# Patient Record
Sex: Female | Born: 1990 | Race: Black or African American | Hispanic: No | Marital: Single | Smoking: Never smoker
Health system: Southern US, Community
[De-identification: ages and names within clinical notes are randomized; demographics above are authoritative.]

## PROBLEM LIST (undated history)

## (undated) ENCOUNTER — Emergency Department (HOSPITAL_COMMUNITY): Admission: EM | Payer: BC Managed Care – PPO | Source: Home / Self Care

## (undated) ENCOUNTER — Inpatient Hospital Stay (HOSPITAL_COMMUNITY): Payer: Self-pay

## (undated) DIAGNOSIS — Z789 Other specified health status: Secondary | ICD-10-CM

## (undated) DIAGNOSIS — Z8619 Personal history of other infectious and parasitic diseases: Secondary | ICD-10-CM

## (undated) HISTORY — PX: NO PAST SURGERIES: SHX2092

---
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
## 2008-07-17 ENCOUNTER — Inpatient Hospital Stay (HOSPITAL_COMMUNITY): Admission: AD | Admit: 2008-07-17 | Discharge: 2008-07-18 | Payer: Self-pay | Admitting: Family Medicine

## 2008-07-17 ENCOUNTER — Ambulatory Visit: Payer: Self-pay | Admitting: Advanced Practice Midwife

## 2008-12-19 ENCOUNTER — Emergency Department (HOSPITAL_COMMUNITY): Admission: EM | Admit: 2008-12-19 | Discharge: 2008-12-19 | Payer: Self-pay | Admitting: Emergency Medicine

## 2009-01-31 ENCOUNTER — Encounter (INDEPENDENT_AMBULATORY_CARE_PROVIDER_SITE_OTHER): Payer: Self-pay | Admitting: Obstetrics and Gynecology

## 2009-01-31 ENCOUNTER — Inpatient Hospital Stay (HOSPITAL_COMMUNITY): Admission: AD | Admit: 2009-01-31 | Discharge: 2009-02-02 | Payer: Self-pay | Admitting: Obstetrics and Gynecology

## 2010-07-01 LAB — COMPREHENSIVE METABOLIC PANEL
ALT: 16 U/L (ref 0–35)
AST: 22 U/L (ref 0–37)
CO2: 21 mEq/L (ref 19–32)
Calcium: 8.9 mg/dL (ref 8.4–10.5)
Chloride: 106 mEq/L (ref 96–112)
GFR calc non Af Amer: >60 mL/min (ref >60)
Glucose, Bld: 74 mg/dL (ref 70–99)
Sodium: 136 mEq/L (ref 135–145)
Total Bilirubin: 0.5 mg/dL (ref 0.3–1.2)

## 2010-07-01 LAB — CBC
HCT: 36.9 % (ref 36.0–46.0)
Hemoglobin: 11.9 g/dL — ABNORMAL LOW (ref 12.0–15.0)
Hemoglobin: 9.2 g/dL — ABNORMAL LOW (ref 12.0–15.0)
MCHC: 32.3 g/dL (ref 30.0–36.0)
MCHC: 33 g/dL (ref 30.0–36.0)
Platelets: 131 10*3/uL — ABNORMAL LOW (ref 150–400)
RBC: 4.35 MIL/uL (ref 3.87–5.11)
RDW: 13.9 % (ref 11.5–15.5)
RDW: 14.3 % (ref 11.5–15.5)

## 2010-07-01 LAB — URIC ACID: Uric Acid, Serum: 4 mg/dL (ref 2.4–7.0)

## 2010-07-03 LAB — POCT RAPID STREP A (OFFICE): Streptococcus, Group A Screen (Direct): NEGATIVE

## 2010-07-08 LAB — URINALYSIS, ROUTINE W REFLEX MICROSCOPIC
Bilirubin Urine: NEGATIVE
Hgb urine dipstick: NEGATIVE
Nitrite: NEGATIVE
Protein, ur: NEGATIVE mg/dL
Urobilinogen, UA: 0.2 mg/dL (ref 0.0–1.0)

## 2010-07-08 LAB — PREGNANCY, URINE: Preg Test, Ur: POSITIVE

## 2011-07-04 ENCOUNTER — Emergency Department (INDEPENDENT_AMBULATORY_CARE_PROVIDER_SITE_OTHER): Payer: PRIVATE HEALTH INSURANCE

## 2011-07-04 ENCOUNTER — Encounter (HOSPITAL_COMMUNITY): Payer: Self-pay | Admitting: *Deleted

## 2011-07-04 ENCOUNTER — Emergency Department (INDEPENDENT_AMBULATORY_CARE_PROVIDER_SITE_OTHER)
Admission: EM | Admit: 2011-07-04 | Discharge: 2011-07-04 | Disposition: A | Discharge status: Home / Self Care | Payer: PRIVATE HEALTH INSURANCE | Source: Home / Self Care | Attending: Family Medicine | Admitting: Family Medicine

## 2011-07-04 DIAGNOSIS — M25539 Pain in unspecified wrist: Secondary | ICD-10-CM

## 2011-07-04 DIAGNOSIS — S60219A Contusion of unspecified wrist, initial encounter: Secondary | ICD-10-CM

## 2011-07-04 DIAGNOSIS — M25531 Pain in right wrist: Secondary | ICD-10-CM

## 2011-07-04 DIAGNOSIS — S60211A Contusion of right wrist, initial encounter: Secondary | ICD-10-CM

## 2011-07-04 MED ORDER — KETOROLAC TROMETHAMINE 60 MG/2ML IM SOLN
60.0000 mg | Freq: Once | INTRAMUSCULAR | Status: AC
  Administered 2011-07-04: 60 mg via INTRAMUSCULAR

## 2011-07-04 MED ORDER — HYDROCODONE-ACETAMINOPHEN 5-325 MG PO TABS: 1.0000 | ORAL_TABLET | Freq: Three times a day (TID) | ORAL | Status: AC | PRN

## 2011-07-04 MED ORDER — MELOXICAM 15 MG PO TABS: 15.0000 mg | ORAL_TABLET | Freq: Every day | ORAL | Status: DC

## 2011-07-04 NOTE — ED Notes (Signed)
Pt with pain swelling posterior right wrist - per pt hit wall hard with palm of right hand last night - wrist snapped back pain and swelling

## 2011-07-04 NOTE — Discharge Instructions (Signed)

## 2011-07-04 NOTE — ED Provider Notes (Signed)
History    Injury to R wrist CSN: [id number]  Arrival date & time 07/04/11  1002   First MD Initiated Contact with Patient 07/04/11 1011      Chief Complaint  Patient presents with  . Wrist Pain  . Wrist Injury    (Consider location/radiation/quality/duration/timing/severity/associated sxs/prior treatment) Patient is a 21 y.o. female presenting with wrist pain and wrist injury. The history is provided by the patient.  Wrist Pain This is a new problem. The current episode started yesterday (Patient states she slapped the wall out of frustration with the palmar surface of her right hand and now has tenderness over the dorsum of her right wrist and swelling.). The problem occurs constantly. The problem has been gradually worsening. Associated symptoms comments: Pain the R wrist. Exacerbated by: movement and use. The symptoms are relieved by nothing.  Wrist Injury     History reviewed. No pertinent past medical history.  History reviewed. No pertinent past surgical history.  History reviewed. No pertinent family history.  History  Substance Use Topics  . Smoking status: Never Smoker   . Smokeless tobacco: Not on file  . Alcohol Use: Yes    OB History    Grav Para Term Preterm Abortions TAB SAB Ect Mult Living                  Review of Systems  Musculoskeletal: Positive for joint swelling and arthralgias.  All other systems reviewed and are negative.    Allergies  Review of patient's allergies indicates no known allergies.  Home Medications  No current outpatient prescriptions on file.  BP 117/66  Pulse 84  Temp(Src) 99.1 F (37.3 C) (Oral)  Resp 18  Wt 125 lb (56.7 kg)  SpO2 99%  LMP 07/01/2011  Physical Exam  Constitutional: She is oriented to person, place, and time. She appears well-developed and well-nourished.  HENT:  Head: Normocephalic.  Musculoskeletal: She exhibits tenderness.       Decrease rang of motion of R wist. Tenderness over the mid  dorsum of the right wrist. No tenderness over the snuffbox is some tenderness along the superior aspect of the radius.  Neurological: She is alert and oriented to person, place, and time.  Skin: Skin is warm and dry.  Psychiatric: She has a normal mood and affect. Her behavior is normal.    ED Course  Procedures (including critical care time)  Labs Reviewed - No data to display Dg Wrist Complete Right  07/04/2011  *RADIOLOGY REPORT*  Clinical Data:  wrist pain  RIGHT WRIST - COMPLETE 3+ VIEW  Comparison: None  Findings: There is no evidence of fracture or dislocation.  There is no evidence of arthropathy or other focal bone abnormality. Soft tissues are unremarkable.  IMPRESSION: Negative examination.  Original Report Authenticated By: Rosealee Albee, M.D.      MDM   Right wrist contusion. Cockup wrist splint. Mobic 15 mg one tablet a day. Hydrocodone 5-25 one by mouth every 6 hours when necessary basis. If not better by Thursday followup orthopedic or primary care.      Hassan Rowan, MD 07/04/11 2145

## 2013-06-10 IMAGING — CR DG WRIST COMPLETE 3+V*R*
2 series · 2 of 2 positions shown · non-contrast
Comparison: None

CLINICAL DATA: wrist pain

RIGHT WRIST - COMPLETE 3+ VIEW

[view not recorded (1 of 2)]
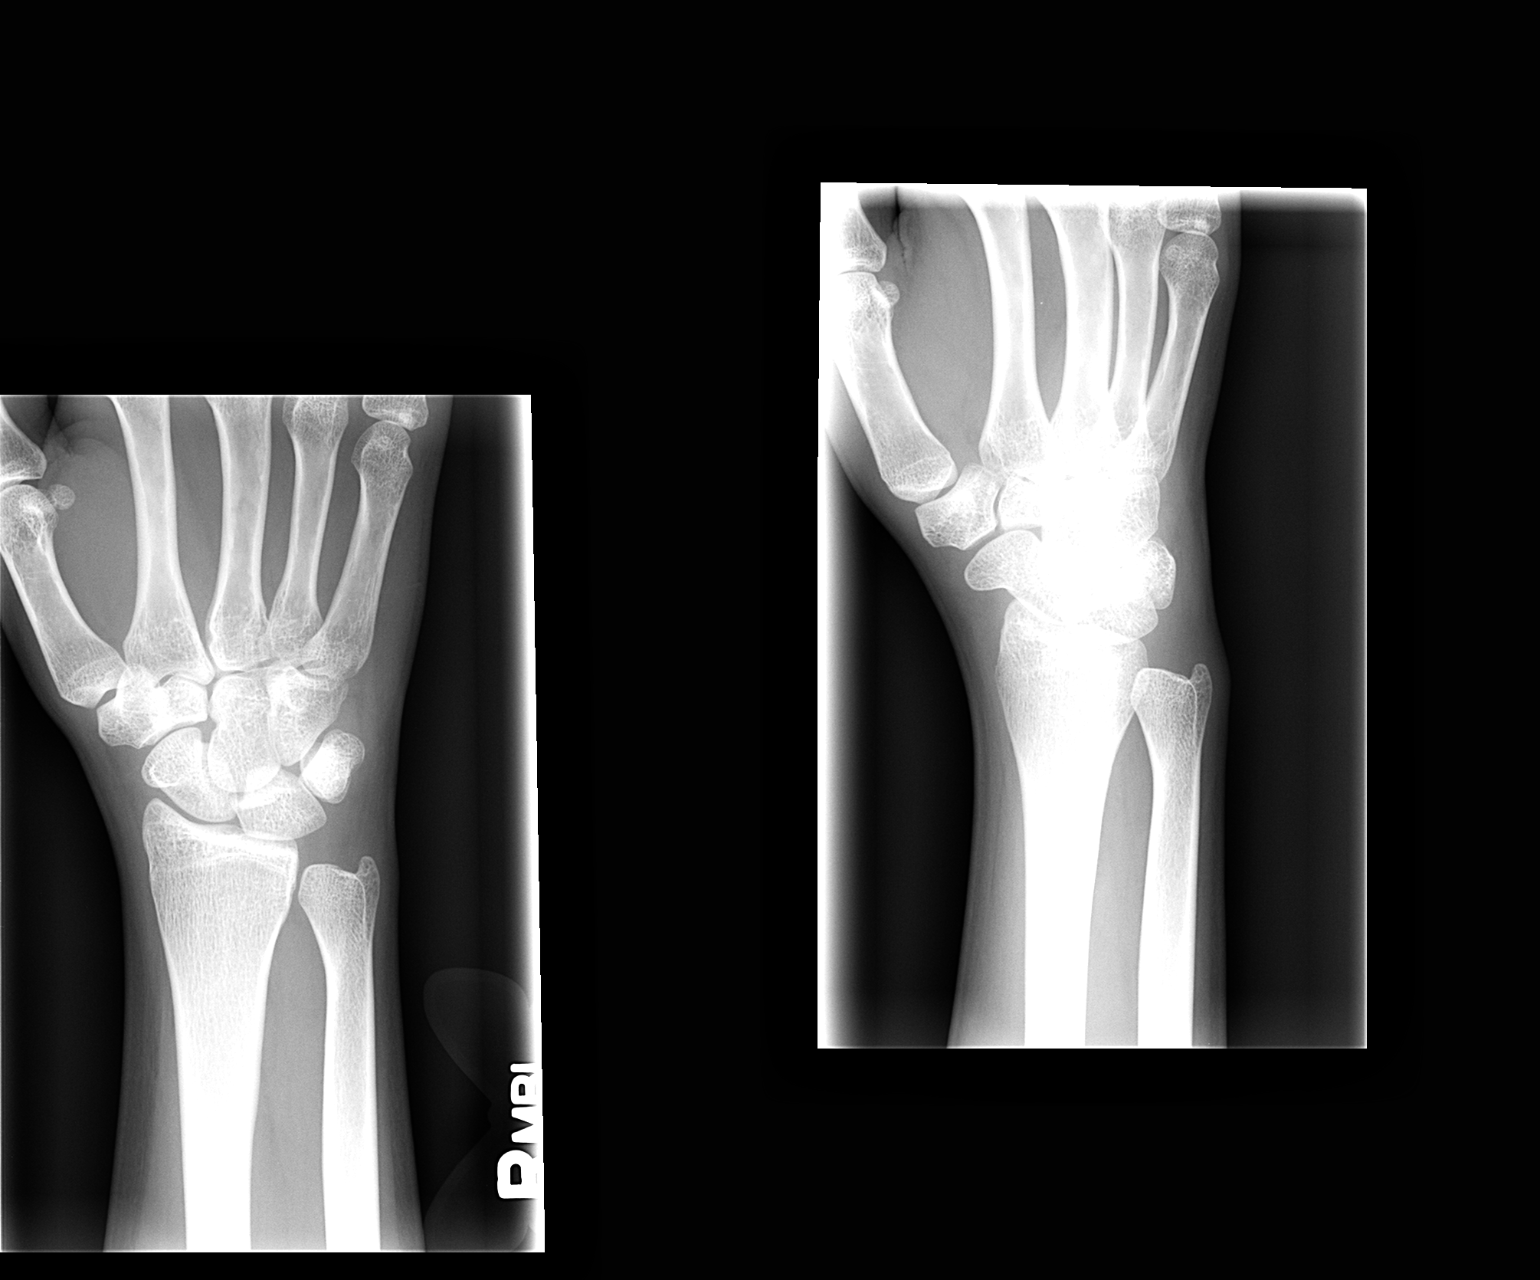

[view not recorded (2 of 2)]
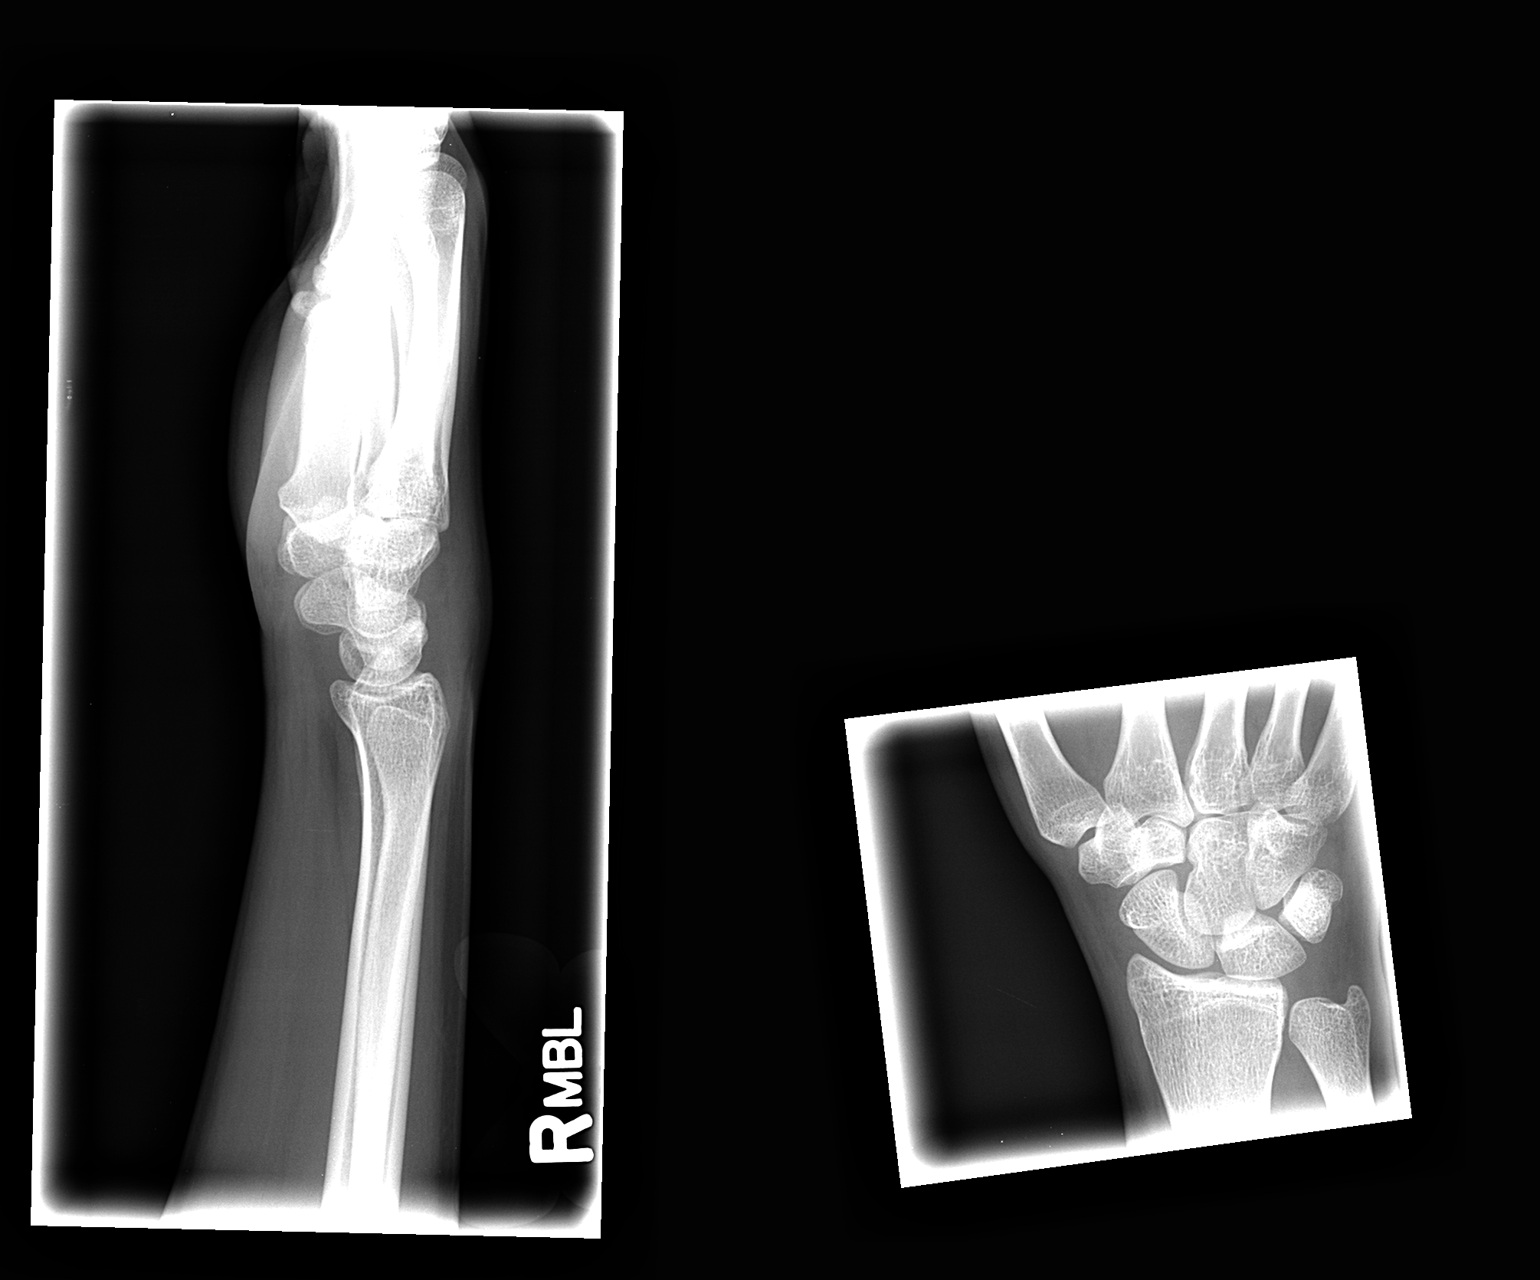

[2 of 2 positions shown; findings below may reference images not displayed]

FINDINGS: There is no evidence of fracture or dislocation.  There
is no evidence of arthropathy or other focal bone abnormality.
Soft tissues are unremarkable.
IMPRESSION: Negative examination.

## 2013-10-16 ENCOUNTER — Emergency Department (HOSPITAL_COMMUNITY)
Admission: EM | Admit: 2013-10-16 | Discharge: 2013-10-16 | Disposition: A | Discharge status: Home / Self Care | Payer: PRIVATE HEALTH INSURANCE | Source: Home / Self Care

## 2014-04-06 ENCOUNTER — Encounter (HOSPITAL_COMMUNITY): Payer: Self-pay | Admitting: Emergency Medicine

## 2014-04-06 ENCOUNTER — Emergency Department (HOSPITAL_COMMUNITY)
Admission: EM | Admit: 2014-04-06 | Discharge: 2014-04-06 | Disposition: A | Discharge status: Home / Self Care | Payer: BLUE CROSS/BLUE SHIELD | Source: Home / Self Care | Attending: Emergency Medicine | Admitting: Emergency Medicine

## 2014-04-06 DIAGNOSIS — J069 Acute upper respiratory infection, unspecified: Secondary | ICD-10-CM

## 2014-04-06 DIAGNOSIS — J029 Acute pharyngitis, unspecified: Secondary | ICD-10-CM

## 2014-04-06 LAB — POCT RAPID STREP A: STREPTOCOCCUS, GROUP A SCREEN (DIRECT): NEGATIVE

## 2014-04-06 NOTE — ED Provider Notes (Signed)
CSN: [id number]     Arrival date & time 04/06/14  1048 History   First MD Initiated Contact with Patient 04/06/14 1116     Chief Complaint  Patient presents with  . Sore Throat   (Consider location/radiation/quality/duration/timing/severity/associated sxs/prior Treatment) HPI Comments: XLK:GMWNPCP:none Reports herself to be otherwise healthy Nonsmoker Works in Therapist, sportsproperty management   Patient is a 24 y.o. female presenting with URI. The history is provided by the patient.  URI Presenting symptoms: congestion, cough and sore throat   Presenting symptoms: no ear pain, no facial pain, no fatigue, no fever and no rhinorrhea   Severity:  Mild Onset quality:  Gradual Duration:  1 week Timing:  Constant Progression:  Improving Chronicity:  New   History reviewed. No pertinent past medical history. History reviewed. No pertinent past surgical history. No family history on file. History  Substance Use Topics  . Smoking status: Never Smoker   . Smokeless tobacco: Not on file  . Alcohol Use: Yes   OB History    No data available     Review of Systems  Constitutional: Negative for fever and fatigue.  HENT: Positive for congestion and sore throat. Negative for ear pain and rhinorrhea.   Respiratory: Positive for cough.   Cardiovascular: Negative.   Musculoskeletal: Negative.     Allergies  Review of patient's allergies indicates no known allergies.  Home Medications   Prior to Admission medications   Medication Sig Start Date End Date Taking? Authorizing Provider  meloxicam (MOBIC) 15 MG tablet Take 1 tablet (15 mg total) by mouth daily. 07/04/11 07/03/12  Jeanelle MallingEugene H Wade, MD   BP 102/60 mmHg  Pulse 70  Temp(Src) 97.9 F (36.6 C) (Oral)  Resp 18  SpO2 100%  LMP 03/29/2014 Physical Exam  Constitutional: She is oriented to person, place, and time. She appears well-developed and well-nourished. No distress.  HENT:  Head: Normocephalic and atraumatic.  Right Ear: Hearing, tympanic  membrane, external ear and ear canal normal.  Left Ear: Hearing, tympanic membrane, external ear and ear canal normal.  Nose: Nose normal.  Mouth/Throat: Uvula is midline, oropharynx is clear and moist and mucous membranes are normal.  Eyes: Conjunctivae are normal. No scleral icterus.  Neck: Normal range of motion. Neck supple.  Cardiovascular: Normal rate, regular rhythm and normal heart sounds.   Pulmonary/Chest: Effort normal and breath sounds normal. No stridor. No respiratory distress. She has no wheezes.  Musculoskeletal: Normal range of motion.  Lymphadenopathy:    She has no cervical adenopathy.  Neurological: She is alert and oriented to person, place, and time.  Skin: Skin is warm and dry. No rash noted. No erythema.  Psychiatric: She has a normal mood and affect. Her behavior is normal.  Nursing note and vitals reviewed.   ED Course  Procedures (including critical care time) Labs Review Labs Reviewed  POCT RAPID STREP A ([facility] URG CARE ONLY)    Imaging Review No results found.   MDM   1. URI (upper respiratory infection)   2. Sore throat    Rapid strep negative Exam benign VS normal Symptomatic care at home Expect improvement over next few days Tylenol, ibuprofen, salt water gargles   Ria ClockJennifer Lee H Genita Nilsson, PA 04/06/14 1155

## 2014-04-06 NOTE — ED Notes (Signed)
C/o ST onset 1 week; yest night was very painful Has also had a dry cough and vomiting due to cough Denies f/v/n/d Alert, no signs of acute distress.

## 2014-04-06 NOTE — Discharge Instructions (Signed)
Rapid strep negative Exam benign VS normal Symptomatic care at home Expect improvement over next few days Tylenol, ibuprofen, salt water gargles Upper Respiratory Infection, Adult An upper respiratory infection (URI) is also sometimes known as the common cold. The upper respiratory tract includes the nose, sinuses, throat, trachea, and bronchi. Bronchi are the airways leading to the lungs. Most people improve within 1 week, but symptoms can last up to 2 weeks. A residual cough may last even longer.  CAUSES Many different viruses can infect the tissues lining the upper respiratory tract. The tissues become irritated and inflamed and often become very moist. Mucus production is also common. A cold is contagious. You can easily spread the virus to others by oral contact. This includes kissing, sharing a glass, coughing, or sneezing. Touching your mouth or nose and then touching a surface, which is then touched by another person, can also spread the virus. SYMPTOMS  Symptoms typically develop 1 to 3 days after you come in contact with a cold virus. Symptoms vary from person to person. They may include:  Runny nose.  Sneezing.  Nasal congestion.  Sinus irritation.  Sore throat.  Loss of voice (laryngitis).  Cough.  Fatigue.  Muscle aches.  Loss of appetite.  Headache.  Low-grade fever. DIAGNOSIS  You might diagnose your own cold based on familiar symptoms, since most people get a cold 2 to 3 times a year. Your caregiver can confirm this based on your exam. Most importantly, your caregiver can check that your symptoms are not due to another disease such as strep throat, sinusitis, pneumonia, asthma, or epiglottitis. Blood tests, throat tests, and X-rays are not necessary to diagnose a common cold, but they may sometimes be helpful in excluding other more serious diseases. Your caregiver will decide if any further tests are required. RISKS AND COMPLICATIONS  You may be at risk for a  more severe case of the common cold if you smoke cigarettes, have chronic heart disease (such as heart failure) or lung disease (such as asthma), or if you have a weakened immune system. The very young and very old are also at risk for more serious infections. Bacterial sinusitis, middle ear infections, and bacterial pneumonia can complicate the common cold. The common cold can worsen asthma and chronic obstructive pulmonary disease (COPD). Sometimes, these complications can require emergency medical care and may be life-threatening. PREVENTION  The best way to protect against getting a cold is to practice good hygiene. Avoid oral or hand contact with people with cold symptoms. Wash your hands often if contact occurs. There is no clear evidence that vitamin C, vitamin E, echinacea, or exercise reduces the chance of developing a cold. However, it is always recommended to get plenty of rest and practice good nutrition. TREATMENT  Treatment is directed at relieving symptoms. There is no cure. Antibiotics are not effective, because the infection is caused by a virus, not by bacteria. Treatment may include:  Increased fluid intake. Sports drinks offer valuable electrolytes, sugars, and fluids.  Breathing heated mist or steam (vaporizer or shower).  Eating chicken soup or other clear broths, and maintaining good nutrition.  Getting plenty of rest.  Using gargles or lozenges for comfort.  Controlling fevers with ibuprofen or acetaminophen as directed by your caregiver.  Increasing usage of your inhaler if you have asthma. Zinc gel and zinc lozenges, taken in the first 24 hours of the common cold, can shorten the duration and lessen the severity of symptoms. Pain medicines may help  with fever, muscle aches, and throat pain. A variety of non-prescription medicines are available to treat congestion and runny nose. Your caregiver can make recommendations and may suggest nasal or lung inhalers for other  symptoms.  HOME CARE INSTRUCTIONS   Only take over-the-counter or prescription medicines for pain, discomfort, or fever as directed by your caregiver.  Use a warm mist humidifier or inhale steam from a shower to increase air moisture. This may keep secretions moist and make it easier to breathe.  Drink enough water and fluids to keep your urine clear or pale yellow.  Rest as needed.  Return to work when your temperature has returned to normal or as your caregiver advises. You may need to stay home longer to avoid infecting others. You can also use a face mask and careful hand washing to prevent spread of the virus. SEEK MEDICAL CARE IF:   After the first few days, you feel you are getting worse rather than better.  You need your caregiver's advice about medicines to control symptoms.  You develop chills, worsening shortness of breath, or brown or red sputum. These may be signs of pneumonia.  You develop yellow or brown nasal discharge or pain in the face, especially when you bend forward. These may be signs of sinusitis.  You develop a fever, swollen neck glands, pain with swallowing, or white areas in the back of your throat. These may be signs of strep throat. SEEK IMMEDIATE MEDICAL CARE IF:   You have a fever.  You develop severe or persistent headache, ear pain, sinus pain, or chest pain.  You develop wheezing, a prolonged cough, cough up blood, or have a change in your usual mucus (if you have chronic lung disease).  You develop sore muscles or a stiff neck. Document Released: 09/08/2000 Document Revised: 06/07/2011 Document Reviewed: 06/20/2013 [facility] Patient Information 2015 [place], [place]. This information is not intended to replace advice given to you by your health care provider. Make sure you discuss any questions you have with your health care provider.  Salt Water Gargle This solution will help make your mouth and throat feel better. HOME CARE INSTRUCTIONS   Mix  1 teaspoon of salt in 8 ounces of warm water.  Gargle with this solution as much or often as you need or as directed. Swish and gargle gently if you have any sores or wounds in your mouth.  Do not swallow this mixture. Document Released: 12/18/2003 Document Revised: 06/07/2011 Document Reviewed: 05/10/2008 [facility] Patient Information 2015 [place], [place]. This information is not intended to replace advice given to you by your health care provider. Make sure you discuss any questions you have with your health care provider.  Sore Throat A sore throat is pain, burning, irritation, or scratchiness of the throat. There is often pain or tenderness when swallowing or talking. A sore throat may be accompanied by other symptoms, such as coughing, sneezing, fever, and swollen neck glands. A sore throat is often the first sign of another sickness, such as a cold, flu, strep throat, or mononucleosis (commonly known as mono). Most sore throats go away without medical treatment. CAUSES  The most common causes of a sore throat include:  A viral infection, such as a cold, flu, or mono.  A bacterial infection, such as strep throat, tonsillitis, or whooping cough.  Seasonal allergies.  Dryness in the air.  Irritants, such as smoke or pollution.  Gastroesophageal reflux disease (GERD). HOME CARE INSTRUCTIONS   Only take over-the-counter medicines as  directed by your caregiver.  Drink enough fluids to keep your urine clear or pale yellow.  Rest as needed.  Try using throat sprays, lozenges, or sucking on hard candy to ease any pain (if older than 4 years or as directed).  Sip warm liquids, such as broth, herbal tea, or warm water with honey to relieve pain temporarily. You may also eat or drink cold or frozen liquids such as frozen ice pops.  Gargle with salt water (mix 1 tsp salt with 8 oz of water).  Do not smoke and avoid secondhand smoke.  Put a cool-mist humidifier in your bedroom at night  to moisten the air. You can also turn on a hot shower and sit in the bathroom with the door closed for 5-10 minutes. SEEK IMMEDIATE MEDICAL CARE IF:  You have difficulty breathing.  You are unable to swallow fluids, soft foods, or your saliva.  You have increased swelling in the throat.  Your sore throat does not get better in 7 days.  You have nausea and vomiting.  You have a fever or persistent symptoms for more than 2-3 days.  You have a fever and your symptoms suddenly get worse. MAKE SURE YOU:   Understand these instructions.  Will watch your condition.  Will get help right away if you are not doing well or get worse. Document Released: 04/22/2004 Document Revised: 03/01/2012 Document Reviewed: 11/21/2011 [facility] Patient Information 2015 [place], [place]. This information is not intended to replace advice given to you by your health care provider. Make sure you discuss any questions you have with your health care provider.

## 2014-04-08 LAB — CULTURE, GROUP A STREP

## 2015-03-30 NOTE — L&D Delivery Note (Signed)
25 y.o. G2P1001 at 7026w0d delivered a viable female infant in cephalic position precipitously before physician could enter room. No nuchal cord. 5 minute delayed cord clamping. Cord clamped x2 and cut. Placenta delivered spontaneously intact, with 3VC. Fundus firm on exam with massage and pitocin. Good hemostasis noted.  Laceration: 2-0 Suture: 3-0 Monocryl 200 mcg Cytotec PR given. Good hemostasis noted.  Mom and baby recovering in LDR.    Apgars: 9/9 Weight: pending, skin to skin    Jen MowElizabeth Deneene Tarver, DO [facility] 11/20/2015, 7:50 AM

## 2015-04-09 ENCOUNTER — Encounter: Payer: Self-pay | Admitting: Family Medicine

## 2015-04-09 ENCOUNTER — Ambulatory Visit (INDEPENDENT_AMBULATORY_CARE_PROVIDER_SITE_OTHER): Payer: Self-pay | Admitting: *Deleted

## 2015-04-09 DIAGNOSIS — Z3201 Encounter for pregnancy test, result positive: Secondary | ICD-10-CM

## 2015-04-09 LAB — POCT PREGNANCY, URINE: Preg Test, Ur: POSITIVE — AB

## 2015-04-09 NOTE — Progress Notes (Signed)
Pt in for pregnancy test. Result is positive. Pt doesn't want to get care here with us. Letter given.

## 2015-05-06 LAB — OB RESULTS CONSOLE ABO/RH: RH TYPE: POSITIVE

## 2015-05-06 LAB — OB RESULTS CONSOLE GC/CHLAMYDIA
Chlamydia: NEGATIVE
Gonorrhea: NEGATIVE

## 2015-05-06 LAB — OB RESULTS CONSOLE PLATELET COUNT: Platelets: 142 10*3/uL

## 2015-05-06 LAB — OB RESULTS CONSOLE RPR: RPR: NONREACTIVE

## 2015-05-06 LAB — OB RESULTS CONSOLE HEPATITIS B SURFACE ANTIGEN: HEP B S AG: NEGATIVE

## 2015-05-06 LAB — OB RESULTS CONSOLE HGB/HCT, BLOOD
HCT: 36 %
HEMOGLOBIN: 12 g/dL

## 2015-05-06 LAB — OB RESULTS CONSOLE RUBELLA ANTIBODY, IGM: RUBELLA: IMMUNE

## 2015-05-06 LAB — OB RESULTS CONSOLE HIV ANTIBODY (ROUTINE TESTING): HIV: NONREACTIVE

## 2015-05-06 LAB — OB RESULTS CONSOLE ANTIBODY SCREEN: Antibody Screen: NEGATIVE

## 2015-05-23 ENCOUNTER — Encounter (HOSPITAL_COMMUNITY): Payer: Self-pay

## 2015-05-23 ENCOUNTER — Inpatient Hospital Stay (HOSPITAL_COMMUNITY)
Admission: AD | Admit: 2015-05-23 | Discharge: 2015-05-23 | Disposition: A | Discharge status: Home / Self Care | Payer: Medicaid Other | Source: Ambulatory Visit | Attending: Obstetrics | Admitting: Obstetrics

## 2015-05-23 DIAGNOSIS — O26892 Other specified pregnancy related conditions, second trimester: Secondary | ICD-10-CM | POA: Insufficient documentation

## 2015-05-23 DIAGNOSIS — N949 Unspecified condition associated with female genital organs and menstrual cycle: Secondary | ICD-10-CM

## 2015-05-23 DIAGNOSIS — R102 Pelvic and perineal pain: Secondary | ICD-10-CM | POA: Diagnosis present

## 2015-05-23 DIAGNOSIS — O9989 Other specified diseases and conditions complicating pregnancy, childbirth and the puerperium: Secondary | ICD-10-CM | POA: Diagnosis not present

## 2015-05-23 DIAGNOSIS — Z3A15 15 weeks gestation of pregnancy: Secondary | ICD-10-CM | POA: Diagnosis not present

## 2015-05-23 DIAGNOSIS — R109 Unspecified abdominal pain: Secondary | ICD-10-CM | POA: Diagnosis not present

## 2015-05-23 DIAGNOSIS — O26899 Other specified pregnancy related conditions, unspecified trimester: Secondary | ICD-10-CM

## 2015-05-23 HISTORY — DX: Other specified health status: Z78.9

## 2015-05-23 LAB — URINALYSIS, ROUTINE W REFLEX MICROSCOPIC
Bilirubin Urine: NEGATIVE
GLUCOSE, UA: NEGATIVE mg/dL
HGB URINE DIPSTICK: NEGATIVE
KETONES UR: NEGATIVE mg/dL
LEUKOCYTES UA: NEGATIVE
Nitrite: NEGATIVE
PH: 7 (ref 5.0–8.0)
Protein, ur: NEGATIVE mg/dL
Specific Gravity, Urine: 1.015 (ref 1.005–1.030)

## 2015-05-23 LAB — CBC
HEMATOCRIT: 33.6 % — AB (ref 36.0–46.0)
HEMOGLOBIN: 11.3 g/dL — AB (ref 12.0–15.0)
MCH: 27.8 pg (ref 26.0–34.0)
MCHC: 33.6 g/dL (ref 30.0–36.0)
MCV: 82.6 fL (ref 78.0–100.0)
Platelets: 157 10*3/uL (ref 150–400)
RBC: 4.07 MIL/uL (ref 3.87–5.11)
RDW: 13.5 % (ref 11.5–15.5)
WBC: 7.7 10*3/uL (ref 4.0–10.5)

## 2015-05-23 NOTE — MAU Provider Note (Signed)
Chief Complaint: Pelvic Pain   First Provider Initiated Contact with Patient 05/23/15 1041      SUBJECTIVE HPI: Kathleen Fleming is a 25 y.o. G2P1001 at [redacted]w[redacted]d by LMP who presents to maternity admissions reporting pain in her right lower abdomen/inguinal area starting today that is intermittent, worse when she stands from sitting or starts to walk, and better when at rest.  She describes the pain as soreness with stronger sharp pain when it is at its worst.  She has not tried any treatments/medications for pain. She has never had this pain before.  She is doing cardio exercise daily and reports drinking only 2 bottles of water daily because she does not want to urinate too frequently during the day.  Pt denies pain now while sitting in bed in MAU. She denies vaginal bleeding, vaginal itching/burning, urinary symptoms, h/a, dizziness, n/v, or fever/chills.     HPI  Past Medical History  Diagnosis Date  . Medical history non-contributory    Past Surgical History  Procedure Laterality Date  . No past surgeries     Social History   Social History  . Marital Status: Single    Spouse Name: N/A  . Number of Children: N/A  . Years of Education: N/A   Occupational History  . Not on file.   Social History Main Topics  . Smoking status: Never Smoker   . Smokeless tobacco: Not on file  . Alcohol Use: Yes     Comment: Occas. prior to preg.  . Drug Use: No  . Sexual Activity: Yes    Birth Control/ Protection: IUD   Other Topics Concern  . Not on file   Social History Narrative   No current facility-administered medications on file prior to encounter.   No current outpatient prescriptions on file prior to encounter.   No Known Allergies  ROS:  Review of Systems  Constitutional: Negative for fever, chills and fatigue.  Respiratory: Negative for shortness of breath.   Cardiovascular: Negative for chest pain.  Gastrointestinal: Positive for abdominal pain.  Genitourinary:  Positive for pelvic pain. Negative for dysuria, flank pain, vaginal bleeding, vaginal discharge, difficulty urinating and vaginal pain.  Neurological: Negative for dizziness and headaches.  Psychiatric/Behavioral: Negative.      I have reviewed patient's Past Medical Hx, Surgical Hx, Family Hx, Social Hx, medications and allergies.   Physical Exam   Patient Vitals for the past 24 hrs:  BP Temp Temp src Pulse Resp Height Weight  05/23/15 1154 98/60 mmHg - - 72 18 - -  05/23/15 1016 100/65 mmHg 97.8 F (36.6 C) Oral 93 16  (1.651 m) 64.32 kg (141 lb 12.8 oz)   Constitutional: Well-developed, well-nourished female in no acute distress.  Cardiovascular: normal rate Respiratory: normal effort GI: Abd soft, mild tenderness in right inguinal area. No RLQ tenderness.  No rebound tenderness or guarding.  Pos BS x 4 MS: Extremities nontender, no edema, normal ROM Neurologic: Alert and oriented x 4.  GU: Neg CVAT.  PELVIC EXAM: Deferred  FHT 154 by doppler  LAB RESULTS Results for orders placed or performed during the hospital encounter of 05/23/15 (from the past 24 hour(s))  Urinalysis, Routine w reflex microscopic (not at [facility])     Status: None   Collection Time: 05/23/15 10:33 AM  Result Value Ref Range   Color, Urine YELLOW YELLOW   APPearance CLEAR CLEAR   Specific Gravity, Urine 1.015 1.005 - 1.030   pH 7.0 5.0 - 8.0  Glucose, UA NEGATIVE NEGATIVE mg/dL   Hgb urine dipstick NEGATIVE NEGATIVE   Bilirubin Urine NEGATIVE NEGATIVE   Ketones, ur NEGATIVE NEGATIVE mg/dL   Protein, ur NEGATIVE NEGATIVE mg/dL   Nitrite NEGATIVE NEGATIVE   Leukocytes, UA NEGATIVE NEGATIVE  CBC     Status: Abnormal   Collection Time: 05/23/15 11:01 AM  Result Value Ref Range   WBC 7.7 4.0 - 10.5 K/uL   RBC 4.07 3.87 - 5.11 MIL/uL   Hemoglobin 11.3 (L) 12.0 - 15.0 g/dL   HCT 45.4 (L) 09.8 - 11.9 %   MCV 82.6 78.0 - 100.0 fL   MCH 27.8 26.0 - 34.0 pg   MCHC 33.6 30.0 - 36.0 g/dL   RDW 14.7  82.9 - 56.2 %   Platelets 157 150 - 400 K/uL       IMAGING No results found.  MAU Management/MDM: Ordered labs and reviewed results.  Likely round ligament pain.  Discussed recommended fluid intake daily with pt.  Continue exercise as desired but listen to her body, rest/limit abdominal exercises if pain persists.  Rest/ice/heat/Tylenol for pain.  Pt stable at time of discharge.  ASSESSMENT 1. Round ligament pain   2. Abdominal pain affecting pregnancy, antepartum     PLAN Discharge home   Medication List    STOP taking these medications        meloxicam 15 MG tablet  Commonly known as:  MOBIC       Follow-up Information    Follow up with Kathreen Cosier, MD.   Specialty:  Obstetrics and Gynecology   Why:  As scheduled, Return to MAU as needed for emergencies   Contact information:   [address] [place] [address] [phone]       Sharen Counter Certified Nurse-Midwife 05/23/2015  2:41 PM

## 2015-05-23 NOTE — MAU Note (Signed)
When sitting for any period of time, having lower abdominal pain, mainly on right side, no dysuria no constipation, no vaginal bleeding no vaginal discharge.

## 2015-05-23 NOTE — Discharge Instructions (Signed)

## 2015-06-02 LAB — CYTOLOGY - PAP: Pap: NEGATIVE

## 2015-08-12 LAB — OB RESULTS CONSOLE RPR: RPR: NONREACTIVE

## 2015-08-20 ENCOUNTER — Encounter: Payer: Self-pay | Admitting: *Deleted

## 2015-08-20 DIAGNOSIS — Z3490 Encounter for supervision of normal pregnancy, unspecified, unspecified trimester: Secondary | ICD-10-CM

## 2015-09-01 ENCOUNTER — Other Ambulatory Visit (HOSPITAL_COMMUNITY): Payer: Self-pay | Admitting: Obstetrics

## 2015-09-01 DIAGNOSIS — Z3689 Encounter for other specified antenatal screening: Secondary | ICD-10-CM

## 2015-09-03 ENCOUNTER — Ambulatory Visit (HOSPITAL_COMMUNITY)
Admission: RE | Admit: 2015-09-03 | Discharge: 2015-09-03 | Disposition: A | Discharge status: Home / Self Care | Payer: Medicaid Other | Source: Ambulatory Visit | Attending: Obstetrics | Admitting: Obstetrics

## 2015-09-03 ENCOUNTER — Encounter (HOSPITAL_COMMUNITY): Payer: Self-pay

## 2015-09-03 DIAGNOSIS — Z36 Encounter for antenatal screening of mother: Secondary | ICD-10-CM | POA: Insufficient documentation

## 2015-09-03 DIAGNOSIS — Z3689 Encounter for other specified antenatal screening: Secondary | ICD-10-CM

## 2015-09-03 DIAGNOSIS — Z3A29 29 weeks gestation of pregnancy: Secondary | ICD-10-CM | POA: Diagnosis not present

## 2015-09-04 ENCOUNTER — Other Ambulatory Visit (HOSPITAL_COMMUNITY): Payer: Self-pay | Admitting: *Deleted

## 2015-09-04 DIAGNOSIS — IMO0001 Reserved for inherently not codable concepts without codable children: Secondary | ICD-10-CM

## 2015-09-04 DIAGNOSIS — O358XX0 Maternal care for other (suspected) fetal abnormality and damage, not applicable or unspecified: Principal | ICD-10-CM

## 2015-09-08 ENCOUNTER — Encounter: Payer: Medicaid Other | Admitting: Obstetrics & Gynecology

## 2015-09-09 ENCOUNTER — Encounter: Payer: Self-pay | Admitting: *Deleted

## 2015-09-16 ENCOUNTER — Encounter: Payer: Medicaid Other | Admitting: Family Medicine

## 2015-10-01 ENCOUNTER — Ambulatory Visit (HOSPITAL_COMMUNITY)
Admission: RE | Admit: 2015-10-01 | Discharge: 2015-10-01 | Disposition: A | Discharge status: Home / Self Care | Payer: Medicaid Other | Source: Ambulatory Visit | Attending: Obstetrics | Admitting: Obstetrics

## 2015-10-01 ENCOUNTER — Encounter (HOSPITAL_COMMUNITY): Payer: Self-pay

## 2015-10-01 DIAGNOSIS — Z36 Encounter for antenatal screening of mother: Secondary | ICD-10-CM | POA: Insufficient documentation

## 2015-10-01 DIAGNOSIS — Z3A33 33 weeks gestation of pregnancy: Secondary | ICD-10-CM | POA: Diagnosis not present

## 2015-10-01 DIAGNOSIS — IMO0001 Reserved for inherently not codable concepts without codable children: Secondary | ICD-10-CM

## 2015-10-01 DIAGNOSIS — O358XX0 Maternal care for other (suspected) fetal abnormality and damage, not applicable or unspecified: Secondary | ICD-10-CM

## 2015-10-24 ENCOUNTER — Encounter (HOSPITAL_COMMUNITY): Payer: Self-pay

## 2015-10-29 ENCOUNTER — Other Ambulatory Visit (HOSPITAL_COMMUNITY)
Admission: RE | Admit: 2015-10-29 | Discharge: 2015-10-29 | Disposition: A | Discharge status: Home / Self Care | Payer: Medicaid Other | Source: Ambulatory Visit | Attending: Obstetrics & Gynecology | Admitting: Obstetrics & Gynecology

## 2015-10-29 ENCOUNTER — Ambulatory Visit (INDEPENDENT_AMBULATORY_CARE_PROVIDER_SITE_OTHER): Payer: Medicaid Other | Admitting: Obstetrics and Gynecology

## 2015-10-29 ENCOUNTER — Encounter: Payer: Self-pay | Admitting: Obstetrics and Gynecology

## 2015-10-29 VITALS — BP 102/70 | HR 103 | Wt 163.5 lb

## 2015-10-29 DIAGNOSIS — Z113 Encounter for screening for infections with a predominantly sexual mode of transmission: Secondary | ICD-10-CM | POA: Diagnosis not present

## 2015-10-29 DIAGNOSIS — Z3493 Encounter for supervision of normal pregnancy, unspecified, third trimester: Secondary | ICD-10-CM | POA: Diagnosis present

## 2015-10-29 LAB — POCT URINALYSIS DIP (DEVICE)
Bilirubin Urine: NEGATIVE
GLUCOSE, UA: NEGATIVE mg/dL
HGB URINE DIPSTICK: NEGATIVE
Ketones, ur: NEGATIVE mg/dL
Leukocytes, UA: NEGATIVE
Nitrite: NEGATIVE
PH: 6 (ref 5.0–8.0)
PROTEIN: NEGATIVE mg/dL
SPECIFIC GRAVITY, URINE: 1.02 (ref 1.005–1.030)
UROBILINOGEN UA: 0.2 mg/dL (ref 0.0–1.0)

## 2015-10-29 LAB — OB RESULTS CONSOLE GBS: STREP GROUP B AG: NEGATIVE

## 2015-10-29 NOTE — Progress Notes (Signed)
Subjective:  Kathleen Fleming is a 25 y.o. G2P1001 at [redacted]w[redacted]d being seen today for ongoing prenatal care, but it is her first visit with [place].  She is currently monitored for the following issues for this low-risk pregnancy and has Supervision of low-risk pregnancy on her problem list. Her labs and [place] where reviewed. She did have an incomplete first anatomic survery with concern for duplicated collecting system, but seemed to have resolved on repeat US. Labs reviewed and all wnl.   Patient reports no complaints.   .  .  Movement: Present. Denies leaking of fluid. No vaginal Bleeding.   The following portions of the patient's history were reviewed and updated as appropriate: allergies, current medications, past family history, past medical history, past social history, past surgical history and problem list. Problem list updated.  Objective:   Vitals:   10/29/15 0809  BP: 102/70  Pulse: (!) 103  Weight: 163 lb 8 oz (74.2 kg)    Fetal Status:     Movement: Present     General:  Alert, oriented and cooperative. Patient is in no acute distress.  Skin: Skin is warm and dry. No rash noted.   Cardiovascular: Normal heart rate noted  Respiratory: Normal respiratory effort, no problems with respiration noted  Abdomen: Soft, gravid, appropriate for gestational age. Pain/Pressure: Present     Pelvic:  Cervical exam performed       2/50/-2  Extremities: Normal range of motion.  Edema: None  Mental Status: Normal mood and affect. Normal behavior. Normal judgment and thought content.   Urinalysis:    neg- protein, neg-glucose  Assessment and Plan:  Pregnancy: G2P1001 at [redacted]w[redacted]d  Superversion of normal pregnancy: GBS, GC/CT today.  Labs reviewed Given labor precautions  Term labor symptoms and general obstetric precautions including but not limited to vaginal bleeding, contractions, leaking of fluid and fetal movement were reviewed in detail with the patient. Please refer to After Visit Summary for  other counseling recommendations.  No Follow-up on file.   Lorne Skeens, MD

## 2015-10-29 NOTE — Patient Instructions (Signed)

## 2015-10-30 LAB — CULTURE, OB URINE: ORGANISM ID, BACTERIA: NO GROWTH

## 2015-10-30 LAB — GC/CHLAMYDIA PROBE AMP (~~LOC~~) NOT AT ARMC
CHLAMYDIA, DNA PROBE: NEGATIVE
NEISSERIA GONORRHEA: NEGATIVE

## 2015-10-31 LAB — CULTURE, BETA STREP (GROUP B ONLY)

## 2015-11-05 ENCOUNTER — Ambulatory Visit (INDEPENDENT_AMBULATORY_CARE_PROVIDER_SITE_OTHER): Payer: Medicaid Other | Admitting: Family Medicine

## 2015-11-05 ENCOUNTER — Encounter: Payer: Self-pay | Admitting: Family Medicine

## 2015-11-05 VITALS — BP 110/64 | HR 88 | Wt 164.0 lb

## 2015-11-05 DIAGNOSIS — Z3493 Encounter for supervision of normal pregnancy, unspecified, third trimester: Secondary | ICD-10-CM

## 2015-11-05 LAB — POCT URINALYSIS DIP (DEVICE)
Bilirubin Urine: NEGATIVE
Glucose, UA: NEGATIVE mg/dL
Hgb urine dipstick: NEGATIVE
Ketones, ur: NEGATIVE mg/dL
NITRITE: NEGATIVE
Protein, ur: 30 mg/dL — AB
Specific Gravity, Urine: 1.02 (ref 1.005–1.030)
UROBILINOGEN UA: 0.2 mg/dL (ref 0.0–1.0)
pH: 6.5 (ref 5.0–8.0)

## 2015-11-05 NOTE — Patient Instructions (Signed)

## 2015-11-05 NOTE — Progress Notes (Signed)
Subjective:  Kathleen Fleming is a 25 y.o. G2P1001 at 3025w6d being seen today for ongoing prenatal care.  She is currently monitored for the following issues for this low-risk pregnancy and has Supervision of low-risk pregnancy on her problem list.  Patient reports no complaints.  Contractions: Not present. Vag. Bleeding: None.  Movement: Present. Denies leaking of fluid.   The following portions of the patient's history were reviewed and updated as appropriate: allergies, current medications, past family history, past medical history, past social history, past surgical history and problem list. Problem list updated.  Objective:   Vitals:   11/05/15 1112  BP: 110/64  Pulse: 88  Weight: 164 lb (74.4 kg)    Fetal Status: Fetal Heart Rate (bpm): 136   Movement: Present     General:  Alert, oriented and cooperative. Patient is in no acute distress.  Skin: Skin is warm and dry. No rash noted.   Cardiovascular: Normal heart rate noted  Respiratory: Normal respiratory effort, no problems with respiration noted  Abdomen: Soft, gravid, appropriate for gestational age. Pain/Pressure: Present     Pelvic:  Cervical exam deferred        Extremities: Normal range of motion.  Edema: None  Mental Status: Normal mood and affect. Normal behavior. Normal judgment and thought content.   Urinalysis: Urine Protein: 1+ Urine Glucose: Negative  Assessment and Plan:  Pregnancy: G2P1001 at 8525w6d  1. Supervision of low-risk pregnancy, third trimester   There are no diagnoses linked to this encounter. Term labor symptoms and general obstetric precautions including but not limited to vaginal bleeding, contractions, leaking of fluid and fetal movement were reviewed in detail with the patient. Please refer to After Visit Summary for other counseling recommendations.  Return in about 1 week (around 11/12/2015) for Routine OB visit.   [facility] HospitalElizabeth Woodland AmboyMumaw, [place]DO

## 2015-11-05 NOTE — Progress Notes (Signed)
Subjective:  Kathleen Fleming is a 25 y.o. G2P1001 at 2944w6d being seen today for her second visit with us for ongoing prenatal care.  She is currently monitored for the following issues for this low-risk pregnancy and has Supervision of low-risk pregnancy on her problem list. 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   Doing well over the past week, though some itching which comes and goes, not in any one particular location. Has not tried medications. Also one episode of lightheadedness and some increased back pain. Continues to feel fetal movement. No contractions, vaginal bleeding, or fluid.    The following portions of the patient's history were reviewed and updated as appropriate: allergies, current medications, past family history, past medical history, past social history, past surgical history and problem list. Problem list updated.  Objective:      Vitals:   11/05/15  BP: 110/64  Pulse: 88  Weight: 164 lb (74.4 kg)   Fetal Status:     Movement: Present     General:  Alert, oriented and cooperative. Patient is in no acute distress.  Skin: Skin is warm and dry. No rash noted.   Cardiovascular: Normal heart rate noted  Respiratory: Normal respiratory effort, no problems with respiration noted  Abdomen: Soft, gravid, appropriate for gestational age. Measuring 38 cm. Pain/Pressure: Present     Extremities: Normal range of motion.  Edema: None  Mental Status: Normal mood and affect. Normal behavior. Normal judgment and thought content.   Urinalysis: trace proteinuria (30 mg/dL), 1+ albumin, trace leukocytes, negative glucose  Assessment and Plan:  Pregnancy: G2P1001 at 3544w6d  Superversion of normal pregnancy: Labs reviewed (GBS, GC/CT negative from last visit). UA today showed trace leukocytes and protein (30 mg/dL), but patient has normal BP and denies LUTS,  headache, chest pain, other symptoms. Discussed a range of patient and partner concerns including placenta care, vaccinations at birth, and other labor preferences. Given labor precautions. Term labor symptoms and general obstetric precautions including but not limited to vaginal bleeding, contractions, leaking of fluid and fetal movement were reviewed in detail with the patient. Please refer to After Visit Summary for other counseling recommendations.  No Follow-up on file.   OB FELLOW MEDICAL STUDENT NOTE ATTESTATION  I have seen and examined this patient. Note this is a Psychologist, occupationalmedical student note and as such does not necessarily reflect the patient's plan of care. Please see progress note for this date of service.    Jen MowElizabeth Mumaw, DO OB Fellow 11/05/2015, 11:46 AM

## 2015-11-19 ENCOUNTER — Ambulatory Visit (INDEPENDENT_AMBULATORY_CARE_PROVIDER_SITE_OTHER): Payer: Medicaid Other | Admitting: Obstetrics & Gynecology

## 2015-11-19 VITALS — BP 106/71 | HR 108 | Wt 168.8 lb

## 2015-11-19 DIAGNOSIS — Z36 Encounter for antenatal screening of mother: Secondary | ICD-10-CM

## 2015-11-19 DIAGNOSIS — O480 Post-term pregnancy: Secondary | ICD-10-CM | POA: Diagnosis present

## 2015-11-19 LAB — POCT URINALYSIS DIP (DEVICE)
Bilirubin Urine: NEGATIVE
Glucose, UA: NEGATIVE mg/dL
Hgb urine dipstick: NEGATIVE
Ketones, ur: NEGATIVE mg/dL
Leukocytes, UA: NEGATIVE
NITRITE: NEGATIVE
PH: 5.5 (ref 5.0–8.0)
PROTEIN: NEGATIVE mg/dL
Specific Gravity, Urine: <=1.005 (ref 1.005–1.030)
Urobilinogen, UA: 0.2 mg/dL (ref 0.0–1.0)

## 2015-11-19 NOTE — Progress Notes (Signed)
Subjective:  Kathleen Fleming is a 25 y.o. G2P1001 at 941w6d being seen today for ongoing prenatal care.  She is currently monitored for the following issues for this low-risk pregnancy and has Supervision of low-risk pregnancy on her problem list.  Patient reports no complaints.  Contractions: Not present. Vag. Bleeding: None.  Movement: Present. Denies leaking of fluid.   The following portions of the patient's history were reviewed and updated as appropriate: allergies, current medications, past family history, past medical history, past social history, past surgical history and problem list. Problem list updated.  Objective:   Vitals:   11/19/15 1515  BP: 106/71  Pulse: (!) 108  Weight: 168 lb 12.8 oz (76.6 kg)    Fetal Status: Fetal Heart Rate (bpm): NST   Movement: Present     General:  Alert, oriented and cooperative. Patient is in no acute distress.  Skin: Skin is warm and dry. No rash noted.   Cardiovascular: Normal heart rate noted  Respiratory: Normal respiratory effort, no problems with respiration noted  Abdomen: Soft, gravid, appropriate for gestational age. Pain/Pressure: Present     Pelvic:  Cervical exam deferred        Extremities: Normal range of motion.  Edema: None  Mental Status: Normal mood and affect. Normal behavior. Normal judgment and thought content.   Urinalysis:      Assessment and Plan:  Pregnancy: G2P1001 at 6741w6d  1. Post term pregnancy, antepartum condition or complication - I have counseled her about the increased risk of still birth after 41 weeks. She declines an induction at this time. She declines a cervical exam. - Amniotic fluid index with NST  Term labor symptoms and general obstetric precautions including but not limited to vaginal bleeding, contractions, leaking of fluid and fetal movement were reviewed in detail with the patient. Please refer to After Visit Summary for other counseling recommendations.  No Follow-up on file.   Allie BossierMyra  C Parish Augustine, MD

## 2015-11-20 ENCOUNTER — Encounter: Payer: Self-pay | Admitting: *Deleted

## 2015-11-20 ENCOUNTER — Inpatient Hospital Stay (HOSPITAL_COMMUNITY)
Admission: AD | Admit: 2015-11-20 | Discharge: 2015-11-22 | DRG: 775 | Disposition: A | Discharge status: Home / Self Care | Payer: Medicaid Other | Source: Ambulatory Visit | Attending: Obstetrics & Gynecology | Admitting: Obstetrics & Gynecology

## 2015-11-20 ENCOUNTER — Encounter (HOSPITAL_COMMUNITY): Payer: Self-pay

## 2015-11-20 DIAGNOSIS — O480 Post-term pregnancy: Secondary | ICD-10-CM | POA: Diagnosis present

## 2015-11-20 DIAGNOSIS — O4202 Full-term premature rupture of membranes, onset of labor within 24 hours of rupture: Secondary | ICD-10-CM | POA: Diagnosis present

## 2015-11-20 DIAGNOSIS — Z3A41 41 weeks gestation of pregnancy: Secondary | ICD-10-CM | POA: Diagnosis not present

## 2015-11-20 DIAGNOSIS — Z3493 Encounter for supervision of normal pregnancy, unspecified, third trimester: Secondary | ICD-10-CM

## 2015-11-20 DIAGNOSIS — IMO0001 Reserved for inherently not codable concepts without codable children: Secondary | ICD-10-CM

## 2015-11-20 HISTORY — DX: Personal history of other infectious and parasitic diseases: Z86.19

## 2015-11-20 LAB — CBC
HCT: 29.4 % — ABNORMAL LOW (ref 36.0–46.0)
Hemoglobin: 9.7 g/dL — ABNORMAL LOW (ref 12.0–15.0)
MCH: 22.9 pg — ABNORMAL LOW (ref 26.0–34.0)
MCHC: 33 g/dL (ref 30.0–36.0)
MCV: 69.5 fL — AB (ref 78.0–100.0)
PLATELETS: 136 10*3/uL — AB (ref 150–400)
RBC: 4.23 MIL/uL (ref 3.87–5.11)
RDW: 16.9 % — AB (ref 11.5–15.5)
WBC: 13.2 10*3/uL — AB (ref 4.0–10.5)

## 2015-11-20 LAB — TYPE AND SCREEN
ABO/RH(D): O POS
ANTIBODY SCREEN: NEGATIVE

## 2015-11-20 LAB — ABO/RH: ABO/RH(D): O POS

## 2015-11-20 LAB — RPR: RPR: NONREACTIVE

## 2015-11-20 MED ORDER — SIMETHICONE 80 MG PO CHEW: 80.0000 mg | CHEWABLE_TABLET | ORAL | Status: DC | PRN

## 2015-11-20 MED ORDER — WITCH HAZEL-GLYCERIN EX PADS: 1.0000 "application " | MEDICATED_PAD | CUTANEOUS | Status: DC | PRN

## 2015-11-20 MED ORDER — ACETAMINOPHEN 325 MG PO TABS
650.0000 mg | ORAL_TABLET | ORAL | Status: DC | PRN
  Administered 2015-11-20 – 2015-11-22 (×2): 650 mg via ORAL
  Filled 2015-11-20 (×2): qty 2

## 2015-11-20 MED ORDER — OXYTOCIN 40 UNITS IN LACTATED RINGERS INFUSION - SIMPLE MED: 2.5000 [IU]/h | INTRAVENOUS | Status: DC | PRN

## 2015-11-20 MED ORDER — SODIUM CHLORIDE 0.9 % IV SOLN: 250.0000 mL | INTRAVENOUS | Status: DC | PRN

## 2015-11-20 MED ORDER — DIBUCAINE 1 % RE OINT
1.0000 "application " | TOPICAL_OINTMENT | RECTAL | Status: DC | PRN
  Filled 2015-11-20: qty 28.4

## 2015-11-20 MED ORDER — SOD CITRATE-CITRIC ACID 500-334 MG/5ML PO SOLN
30.0000 mL | ORAL | Status: DC | PRN
Start: 2015-11-20 — End: 2015-11-20

## 2015-11-20 MED ORDER — IBUPROFEN 600 MG PO TABS
600.0000 mg | ORAL_TABLET | Freq: Four times a day (QID) | ORAL | Status: DC
  Administered 2015-11-20 – 2015-11-22 (×10): 600 mg via ORAL
  Filled 2015-11-20 (×10): qty 1

## 2015-11-20 MED ORDER — MISOPROSTOL 200 MCG PO TABS
ORAL_TABLET | ORAL | Status: AC
  Administered 2015-11-20: 200 ug
  Filled 2015-11-20: qty 1

## 2015-11-20 MED ORDER — LIDOCAINE HCL (PF) 1 % IJ SOLN
30.0000 mL | INTRAMUSCULAR | Status: DC | PRN
  Administered 2015-11-20: 30 mL via SUBCUTANEOUS
  Filled 2015-11-20: qty 30

## 2015-11-20 MED ORDER — OXYCODONE-ACETAMINOPHEN 5-325 MG PO TABS: 1.0000 | ORAL_TABLET | ORAL | Status: DC | PRN

## 2015-11-20 MED ORDER — PRENATAL MULTIVITAMIN CH
1.0000 | ORAL_TABLET | Freq: Every day | ORAL | Status: DC
  Administered 2015-11-21 – 2015-11-22 (×2): 1 via ORAL
  Filled 2015-11-20 (×3): qty 1

## 2015-11-20 MED ORDER — ZOLPIDEM TARTRATE 5 MG PO TABS: 5.0000 mg | ORAL_TABLET | Freq: Every evening | ORAL | Status: DC | PRN

## 2015-11-20 MED ORDER — MISOPROSTOL 200 MCG PO TABS
ORAL_TABLET | ORAL | Status: AC
  Filled 2015-11-20: qty 1

## 2015-11-20 MED ORDER — BENZOCAINE-MENTHOL 20-0.5 % EX AERO
1.0000 "application " | INHALATION_SPRAY | CUTANEOUS | Status: DC | PRN
  Administered 2015-11-20: 1 via TOPICAL
  Filled 2015-11-20 (×3): qty 56

## 2015-11-20 MED ORDER — SENNOSIDES-DOCUSATE SODIUM 8.6-50 MG PO TABS
2.0000 | ORAL_TABLET | ORAL | Status: DC
  Administered 2015-11-20 – 2015-11-21 (×2): 2 via ORAL
  Filled 2015-11-20 (×2): qty 2

## 2015-11-20 MED ORDER — ONDANSETRON HCL 4 MG/2ML IJ SOLN: 4.0000 mg | INTRAMUSCULAR | Status: DC | PRN

## 2015-11-20 MED ORDER — OXYCODONE-ACETAMINOPHEN 5-325 MG PO TABS
2.0000 | ORAL_TABLET | ORAL | Status: DC | PRN
  Administered 2015-11-20: 2 via ORAL
  Filled 2015-11-20: qty 2

## 2015-11-20 MED ORDER — DIPHENHYDRAMINE HCL 25 MG PO CAPS: 25.0000 mg | ORAL_CAPSULE | Freq: Four times a day (QID) | ORAL | Status: DC | PRN

## 2015-11-20 MED ORDER — OXYTOCIN 40 UNITS IN LACTATED RINGERS INFUSION - SIMPLE MED
2.5000 [IU]/h | INTRAVENOUS | Status: DC
  Filled 2015-11-20: qty 1000

## 2015-11-20 MED ORDER — OXYTOCIN BOLUS FROM INFUSION
500.0000 mL | Freq: Once | INTRAVENOUS | Status: AC
  Administered 2015-11-20: 500 mL via INTRAVENOUS

## 2015-11-20 MED ORDER — TETANUS-DIPHTH-ACELL PERTUSSIS 5-2.5-18.5 LF-MCG/0.5 IM SUSP
0.5000 mL | Freq: Once | INTRAMUSCULAR | Status: AC
  Administered 2015-11-22: 0.5 mL via INTRAMUSCULAR
  Filled 2015-11-20: qty 0.5

## 2015-11-20 MED ORDER — COCONUT OIL OIL
1.0000 "application " | TOPICAL_OIL | Status: DC | PRN
  Filled 2015-11-20: qty 120

## 2015-11-20 MED ORDER — SODIUM CHLORIDE 0.9% FLUSH: 3.0000 mL | Freq: Two times a day (BID) | INTRAVENOUS | Status: DC

## 2015-11-20 MED ORDER — LACTATED RINGERS IV SOLN
INTRAVENOUS | Status: DC
  Administered 2015-11-20: 04:00:00 via INTRAVENOUS

## 2015-11-20 MED ORDER — LACTATED RINGERS IV SOLN: 500.0000 mL | INTRAVENOUS | Status: DC | PRN

## 2015-11-20 MED ORDER — SODIUM CHLORIDE 0.9% FLUSH: 3.0000 mL | INTRAVENOUS | Status: DC | PRN

## 2015-11-20 MED ORDER — HYDROCODONE-ACETAMINOPHEN 5-325 MG PO TABS
1.0000 | ORAL_TABLET | Freq: Four times a day (QID) | ORAL | Status: DC | PRN
  Administered 2015-11-20 (×2): 1 via ORAL
  Administered 2015-11-21 (×2): 2 via ORAL
  Filled 2015-11-20 (×4): qty 2
  Filled 2015-11-20: qty 1

## 2015-11-20 MED ORDER — ONDANSETRON HCL 4 MG/2ML IJ SOLN: 4.0000 mg | Freq: Four times a day (QID) | INTRAMUSCULAR | Status: DC | PRN

## 2015-11-20 MED ORDER — ONDANSETRON HCL 4 MG PO TABS
4.0000 mg | ORAL_TABLET | ORAL | Status: DC | PRN
  Administered 2015-11-21 – 2015-11-22 (×3): 4 mg via ORAL
  Filled 2015-11-20 (×3): qty 1

## 2015-11-20 MED ORDER — ACETAMINOPHEN 325 MG PO TABS: 650.0000 mg | ORAL_TABLET | ORAL | Status: DC | PRN

## 2015-11-20 NOTE — H&P (Signed)
LABOR ADMISSION HISTORY AND PHYSICAL  Kathleen Fleming is a 25 y.o. female G2P1001 with IUP at [redacted]w[redacted]d by LMP presenting in active labor. She reports +FM, + contractions, No LOF, no VB, no blurry vision, headaches or peripheral edema, and RUQ pain.  She plans on breast feeding. She request Mirena for birth control.  Dating: By LMP --->  Estimated Date of Delivery: 11/13/15  Sono:    @[redacted]w[redacted]d , CWD, normal anatomy, cephalic presentation,  2410g, 69% EFW   Prenatal History/Complications:  Past Medical History: Past Medical History:  Diagnosis Date  . Medical history non-contributory     Past Surgical History: Past Surgical History:  Procedure Laterality Date  . NO PAST SURGERIES      Obstetrical History: OB History    Gravida Para Term Preterm AB Living   2 1 1  0 0 1   SAB TAB Ectopic Multiple Live Births   0 0 0 0 1      Social History: Social History   Social History  . Marital status: Single    Spouse name: N/A  . Number of children: N/A  . Years of education: N/A   Social History Main Topics  . Smoking status: Never Smoker  . Smokeless tobacco: Never Used  . Alcohol use Yes     Comment: Occas. prior to preg.  . Drug use: No  . Sexual activity: Yes    Birth control/ protection: IUD   Other Topics Concern  . None   Social History Narrative  . None    Family History: Family History  Problem Relation Age of Onset  . Cancer Neg Hx   . Diabetes Neg Hx   . Hypertension Neg Hx     Allergies: No Known Allergies  No prescriptions prior to admission.     Review of Systems   All systems reviewed and negative except as stated in HPI  BP 119/77 (BP Location: Right Arm)   Pulse 105   Temp 99.2 F (37.3 C) (Oral)   Resp 17   Ht 5\' 5"  (1.651 m)   Wt 76.2 kg (168 lb)   LMP 02/06/2015 (Exact Date)   BMI 27.96 kg/m  General appearance: alert and cooperative Abdomen: soft, non-tender; bowel sounds normal Extremities: Homans sign is negative, no sign  of DVT, edema Presentation: cephalic Fetal monitoringBaseline: 145 bpm, Variability: Good {> 6 bpm), Accelerations: Reactive and Decelerations: Absent Uterine activityFrequency: Every 3 minutes     Prenatal labs: ABO, Rh: O/Positive/-- (02/07 0000) Antibody: Negative (02/07 0000) Rubella: !Error! RPR: Nonreactive (05/16 0000)  HBsAg: Negative (02/07 0000)  HIV: Non-reactive (02/07 0000)  GBS:  negative  1 hr Glucola 93 Genetic screening  Normal  Anatomy US Normal   Prenatal Transfer Tool  Maternal Diabetes: No Genetic Screening: Normal Maternal Ultrasounds/Referrals: Normal Fetal Ultrasounds or other Referrals:  None Maternal Substance Abuse:  No Significant Maternal Medications:  None Significant Maternal Lab Results: None  Results for orders placed or performed in visit on 11/19/15 (from the past 24 hour(s))  POCT urinalysis dip (device)   Collection Time: 11/19/15  3:07 PM  Result Value Ref Range   Glucose, UA NEGATIVE NEGATIVE mg/dL   Bilirubin Urine NEGATIVE NEGATIVE   Ketones, ur NEGATIVE NEGATIVE mg/dL   Specific Gravity, Urine <=1.005 1.005 - 1.030   Hgb urine dipstick NEGATIVE NEGATIVE   pH 5.5 5.0 - 8.0   Protein, ur NEGATIVE NEGATIVE mg/dL   Urobilinogen, UA 0.2 0.0 - 1.0 mg/dL   Nitrite NEGATIVE NEGATIVE  Leukocytes, UA NEGATIVE NEGATIVE    Patient Active Problem List   Diagnosis Date Noted  . Supervision of low-risk pregnancy 08/20/2015    Assessment: Kathleen SchoonerJeanne Fleming is a 25 y.o. G2P1001 at 6074w0d here for active labor  #Labor:Expectant management - birth plan to squat on all fours  #Pain: No epidural  #FWB: Category  1 #ID: GBS negative  #MOF: Breast  #MOC:Mirena  #Circ: n/a    Kathleen CharsAsiyah Mikell, MD    OB FELLOW HISTORY AND PHYSICAL ATTESTATION  I have seen and examined this patient; I agree with above documentation in the resident's note.    Kathleen MowElizabeth Raj Landress, DO OB Fellow 11/21/2015, 8:53 AM

## 2015-11-20 NOTE — MAU Note (Signed)
Pt c/o contractions that started around 0130 this morning. Pt denies bleeding and leaking. Pt states baby is moving normally.

## 2015-11-20 NOTE — Plan of Care (Signed)
Problem: Pain Management: Goal: General experience of comfort will improve and pain level will decrease [facility] has been experiencing mild to moderate perineal discomfort, especially upon movement. Patient experienced one episode of vomiting after eating breakfast at 1030. She attributes this to having taken two Percocet at 0822 prior to transfer to room 131. Patient offered and accepted acetominophen 650 mg.along with 600 mg.ibuprofen at 1830. Patient has also been using cold pads and the Dermoplast spray at regular intervals. We will follow-up on this plan for pain management.

## 2015-11-20 NOTE — Lactation Note (Signed)
This note was copied from a baby's chart. Lactation Consultation Note  Patient Name: Girl Waunita SchoonerJeanne Bogus ZOXWR'UToday's Date: 11/20/2015 Reason for consult: Initial assessment;Other (Comment) (LC encouraged mom to call with feeding cues for latch  assessment )  Baby is 6 hours old, and has been to the breast 3 times with 2 latches ( 45 mins , and 17 mins). And one attempt. Last fed at 1308. HNV , HNS,. LC explained breast feeding basics and newborn feeding behaviors, also need for LATCH score QS .  Mom expressed concern she tried using her pump she brought from home and didn't get any milk. LC explained it is a slow process.  LC mentioned to mom when she calls with feeding cues , the RN or LC can show her how to hand express.  Per mom not active with WIC.  Mother informed of post-discharge support and given phone number to the lactation department, including services for phone call  assistance; out-patient appointments; and breastfeeding support group. List of other breastfeeding resources in the community  given in the handout. Encouraged mother to call for problems or concerns related to breastfeeding. Mom receptive to teaching and dad.  LC encouraged mom to page with feeding cues. Baby presently sleeping in crib.   Maternal Data    Feeding Feeding Type:  (baby last fed at 1308 for 17 mins ) Length of feed: 17 min  LATCH Score/Interventions                      Lactation Tools Discussed/Used WIC Program: No (per mom )   Consult Status Consult Status: Follow-up Date: 11/20/15 Follow-up type: In-patient    Kathrin Greathouseorio, Virlan Kempker Ann 11/20/2015, 1:50 PM

## 2015-11-21 LAB — CBC
HCT: 25.6 % — ABNORMAL LOW (ref 36.0–46.0)
Hemoglobin: 8.1 g/dL — ABNORMAL LOW (ref 12.0–15.0)
MCH: 22.3 pg — AB (ref 26.0–34.0)
MCHC: 31.6 g/dL (ref 30.0–36.0)
MCV: 70.5 fL — AB (ref 78.0–100.0)
PLATELETS: 135 10*3/uL — AB (ref 150–400)
RBC: 3.63 MIL/uL — ABNORMAL LOW (ref 3.87–5.11)
RDW: 17.2 % — AB (ref 11.5–15.5)
WBC: 13.9 10*3/uL — ABNORMAL HIGH (ref 4.0–10.5)

## 2015-11-21 MED ORDER — HYDROCODONE-ACETAMINOPHEN 5-325 MG PO TABS
1.0000 | ORAL_TABLET | ORAL | Status: DC | PRN
  Administered 2015-11-21 – 2015-11-22 (×2): 1 via ORAL
  Filled 2015-11-21 (×2): qty 1

## 2015-11-21 NOTE — Progress Notes (Signed)
Patient vomited a large amount of undigested food at 1840. She had been given 2 Norco tablets at 1623 after having eaten several pieces of vegetable pizza. She was also given her scheduled 600 mg. Ibuprofen shortly at 1814. This is the second episode of vomiting since taking oxycodone and hydrocodone for severe perineal discomfort after delivery. Patient was given Zofran 4 mg by mouth at 1854.   Dr. Genevie AnnSchenk was informed of the information above. Plan to continue to offer Zofran with Norco tablet and modify Norco administration.

## 2015-11-21 NOTE — Plan of Care (Signed)
Problem: Pain Management: Goal: General experience of comfort will improve and pain level will decrease Outcome: Progressing Patient continues to report mild to moderate-severe perineal soreness with activity. Discomfort managed well with repositioning, use of Norco 2 tablets every 6 hours, and use of Dermaplast after peri-care. Plan to instruct patient on the use of a Sitz bath. The second degree perineal laceration is well approximated and shows minimal edema.

## 2015-11-21 NOTE — Lactation Note (Signed)
This note was copied from a baby's chart. Lactation Consultation Note Follow up visit at 34 hours of age.  Baby has had 6 breast feedings with a spoon feeding of 3mls and adequate output. Baby is latched now.  Mom is in side-lying position on right side feeding from left breast.  Discussed normal side lying position and mom is aware.  Baby has good strong jaw movements and rhythmic sucking.  Mom reports hearing some swallows and reports minimal pain with latch on.  Mom observed to pull baby off nipple and encouraged mom to use finger to break seal to unlatch baby as needed.  Nipple is slightly irregular shaped and assisted with getting a wide open mouth for a deeper latch.  Baby maintains feeding well.  MOm is concerned about milk supply, she has pumped a few times and only sees a few drops of colostrum.  LC encouraged mom as normal and reviewed natural increase in volume with demand.  LC encouraged mom to work on hand expression and offer spoon feedings if she continus to be concerned about her milk supply. Mom denies further concerns at this time.       Patient Name: Kathleen Waunita SchoonerJeanne Maurin RUEAV'WToday's Date: 11/21/2015 Reason for consult: Follow-up assessment   Maternal Data    Feeding Feeding Type: Breast Fed Length of feed: 15 min  LATCH Score/Interventions Latch: Grasps breast easily, tongue down, lips flanged, rhythmical sucking. Intervention(s): Adjust position;Breast compression  Audible Swallowing: A few with stimulation Intervention(s): Skin to skin;Hand expression;Alternate breast massage  Type of Nipple: Everted at rest and after stimulation  Comfort (Breast/Nipple): Soft / non-tender     Hold (Positioning): Assistance needed to correctly position infant at breast and maintain latch. Intervention(s): Breastfeeding basics reviewed;Support Pillows;Position options;Skin to skin  LATCH Score: 8  Lactation Tools Discussed/Used     Consult Status Consult Status: Follow-up Date:  11/22/15 Follow-up type: In-patient    [name], Arvella MerlesJana Lynn 11/21/2015, 5:22 PM

## 2015-11-21 NOTE — Progress Notes (Signed)
Post Partum Day 1 Subjective: no complaints, up ad lib, voiding, tolerating PO and + flatus  Objective: Blood pressure 101/69, pulse 83, temperature 98.1 F (36.7 C), resp. rate 18, height 5\' 5"  (1.651 m), weight 76.2 kg (168 lb), last menstrual period 02/06/2015, SpO2 100 %, unknown if currently breastfeeding.  Physical Exam:  General: alert, cooperative and no distress Lochia: appropriate Uterine Fundus: firm DVT Evaluation: No evidence of DVT seen on physical exam. Negative Homan's sign.   Recent Labs  11/20/15 0432 11/21/15 0527  HGB 9.7* 8.1*  HCT 29.4* 25.6*    Assessment/Plan: Plan for discharge tomorrow, breast feeding.   LOS: 1 day   Leland HerElsia J Yoo PGY-1 11/21/2015, 7:57 AM   OB FELLOW POSTPARTUM PROGRESS NOTE ATTESTATION  I have seen and examined this patient and agree with above documentation in the resident's note.   Ernestina PennaNicholas Cherokee Boccio, MD 10:15 AM

## 2015-11-22 MED ORDER — HYDROCODONE-ACETAMINOPHEN 5-325 MG PO TABS: 1.0000 | ORAL_TABLET | ORAL | 0 refills | Status: DC | PRN

## 2015-11-22 MED ORDER — SENNOSIDES-DOCUSATE SODIUM 8.6-50 MG PO TABS: 2.0000 | ORAL_TABLET | ORAL | 0 refills | Status: DC

## 2015-11-22 MED ORDER — IBUPROFEN 600 MG PO TABS: 600.0000 mg | ORAL_TABLET | Freq: Four times a day (QID) | ORAL | 0 refills | Status: DC

## 2015-11-22 NOTE — Lactation Note (Signed)
This note was copied from a baby's chart. Lactation Consultation Note: Experienced BF mom has baby latched to breast when I went into room. Reports baby has been latching well with no pain. Asking about when to pump- has Spectra pump for home. She has used it a couple of times here and reports it feels fine. No further questions at present. Reviewed our phone number to call with questions/concerns. To call prn  Patient Name: Kathleen Fleming ZOXWR'UToday's Date: 11/22/2015 Reason for consult: FollowWaunita Schooner-up assessment   Maternal Data Formula Feeding for Exclusion: No Has patient been taught Hand Expression?: Yes Does the patient have breastfeeding experience prior to this delivery?: Yes  Feeding Feeding Type: Breast Fed  LATCH Score/Interventions Latch: Grasps breast easily, tongue down, lips flanged, rhythmical sucking.  Audible Swallowing: A few with stimulation  Type of Nipple: Everted at rest and after stimulation  Comfort (Breast/Nipple): Soft / non-tender     Hold (Positioning): No assistance needed to correctly position infant at breast.  LATCH Score: 9  Lactation Tools Discussed/Used [facility]: No   Consult Status Consult Status: Complete    Kathleen Fleming, Kathleen Fleming D 11/22/2015, 10:35 AM

## 2015-11-22 NOTE — Discharge Instructions (Signed)

## 2015-11-22 NOTE — Discharge Summary (Signed)
OB Discharge Summary     Patient Name: Kathleen Fleming DOB: 1991-01-10 MRN: [id number]  Date of admission: 11/20/2015 Delivering MD: Jen Mow [facility]   Date of discharge: 11/22/2015  Admitting diagnosis: 41 WEEKS CTX Intrauterine pregnancy: [redacted]w[redacted]d     Secondary diagnosis:  Active Problems:   Active labor at term  Additional problems: None      Discharge diagnosis: Term Pregnancy Delivered                                                                                                Post partum procedures:none  Augmentation: none  Complications: None  Hospital course:  Onset of Labor With Vaginal Delivery     25 y.o. yo [id number] at [redacted]w[redacted]d was admitted in Active Labor on 11/20/2015. Patient had an uncomplicated labor course as follows:  Membrane Rupture Time/Date: 5:00 AM ,11/20/2015   Intrapartum Procedures: Episiotomy: None [1]                                         Lacerations:  2nd degree [3];Perineal [11]  Patient had a delivery of a Viable infant. 11/20/2015  Information for the patient's newborn:  Ronnika, Collett Girl Teren [[id number]]  Delivery Method: Vaginal, Spontaneous Delivery (Filed from Delivery Summary)    Pateint had an uncomplicated postpartum course.  She is ambulating, tolerating a regular diet, passing flatus, and urinating well. Patient is discharged home in stable condition on 11/22/15.    Physical exam  Vitals:   11/20/15 1922 11/21/15 0630 11/21/15 1927 11/22/15 0659  BP: (!) 94/59 101/69 (!) 96/55 (!) 97/59  Pulse: 79 83 79 66  Resp: 16 18 18 16   Temp: 98.7 F (37.1 C) 98.1 F (36.7 C) 98.1 F (36.7 C) 97.3 F (36.3 C)  TempSrc: Oral  Oral Oral  SpO2:   100%   Weight:      Height:       General: alert and no distress Lochia: appropriate Uterine Fundus: firm Incision: N/A DVT Evaluation: No evidence of DVT seen on physical exam. No cords or calf tenderness. Labs: Lab Results  Component Value Date   WBC 13.9 (H) 11/21/2015   HGB 8.1 (L) 11/21/2015   HCT 25.6 (L) 11/21/2015   MCV 70.5 (L) 11/21/2015   PLT 135 (L) 11/21/2015   CMP Latest Ref Rng & Units 01/31/2009  Glucose 70 - 99 mg/dL 74  BUN 6 - 23 mg/dL 2(L)  Creatinine 0.4 - 1.2 mg/dL 1.30  Sodium 865 - 784 mEq/L 136  Potassium 3.5 - 5.1 mEq/L 4.0  Chloride 96 - 112 mEq/L 106  CO2 19 - 32 mEq/L 21  Calcium 8.4 - 10.5 mg/dL 8.9  Total Protein 6.0 - 8.3 g/dL 6.1  Total Bilirubin 0.3 - 1.2 mg/dL 0.5  Alkaline Phos 39 - 117 U/L 225(H)  AST 0 - 37 U/L 22  ALT 0 - 35 U/L 16    Discharge instruction: per After Visit Summary and "Baby and Me Booklet".  After visit meds:  Medication List    TAKE these medications   HYDROcodone-acetaminophen 5-325 MG tablet Commonly known as:  NORCO/VICODIN Take 1 tablet by mouth every 4 (four) hours as needed for severe pain.   ibuprofen 600 MG tablet Commonly known as:  ADVIL,MOTRIN Take 1 tablet (600 mg total) by mouth every 6 (six) hours.   senna-docusate 8.6-50 MG tablet Commonly known as:  Senokot-S Take 2 tablets by mouth daily.       Diet: routine diet  Activity: Advance as tolerated. Pelvic rest for 6 weeks.   Outpatient follow up:6 weeks Follow up Appt: Future Appointments Date Time Provider Department Center  01/12/2016 10:20 AM Deirdre Colin Mulders Poe, CNM [facility]   Follow up Visit:No Follow-up on file.  Postpartum contraception: IUD Mirena  Newborn Data: Live born female  Birth Weight: 8 lb 6.6 oz (3815 g) APGAR: 9, 9  Baby Feeding: Breast Disposition:home with mother   11/22/2015 De Hollingsheadatherine L Wallace, DO  OB FELLOW DISCHARGE ATTESTATION  I have seen and examined this patient and agree with above documentation in the resident's note.   Ernestina PennaNicholas Schenk, MD 11:43 AM

## 2015-11-24 ENCOUNTER — Other Ambulatory Visit: Payer: Medicaid Other | Admitting: Obstetrics and Gynecology

## 2015-11-27 ENCOUNTER — Inpatient Hospital Stay (HOSPITAL_COMMUNITY): Payer: Medicaid Other

## 2015-11-28 ENCOUNTER — Inpatient Hospital Stay (HOSPITAL_COMMUNITY)
Admission: AD | Admit: 2015-11-28 | Discharge: 2015-11-28 | Disposition: A | Discharge status: Home / Self Care | Payer: Medicaid Other | Source: Ambulatory Visit | Attending: Obstetrics and Gynecology | Admitting: Obstetrics and Gynecology

## 2015-11-28 DIAGNOSIS — R102 Pelvic and perineal pain: Secondary | ICD-10-CM | POA: Insufficient documentation

## 2015-11-28 DIAGNOSIS — N898 Other specified noninflammatory disorders of vagina: Secondary | ICD-10-CM | POA: Diagnosis not present

## 2015-11-28 DIAGNOSIS — N8110 Cystocele, unspecified: Secondary | ICD-10-CM | POA: Diagnosis not present

## 2015-11-28 DIAGNOSIS — Z79899 Other long term (current) drug therapy: Secondary | ICD-10-CM | POA: Insufficient documentation

## 2015-11-28 DIAGNOSIS — IMO0002 Reserved for concepts with insufficient information to code with codable children: Secondary | ICD-10-CM

## 2015-11-28 NOTE — Discharge Instructions (Signed)
Kegel Exercises  The goal of Kegel exercises is to isolate and exercise your pelvic floor muscles. These muscles act as a hammock that supports the rectum, vagina, small intestine, and uterus. As the muscles weaken, the hammock sags and these organs are displaced from their normal positions. Kegel exercises can strengthen your pelvic floor muscles and help you to improve bladder and bowel control, improve sexual response, and help reduce many problems and some discomfort during pregnancy. Kegel exercises can be done anywhere and at any time.  HOW TO PERFORM KEGEL EXERCISES  1. Locate your pelvic floor muscles. To do this, squeeze (contract) the muscles that you use when you try to stop the flow of urine. You will feel a tightness in the vaginal area (women) and a tight lift in the rectal area (men and women).  2. When you begin, contract your pelvic muscles tight for 2-5 seconds, then relax them for 2-5 seconds. This is one set. Do 4-5 sets with a short pause in between.  3. Contract your pelvic muscles for 8-10 seconds, then relax them for 8-10 seconds. Do 4-5 sets. If you cannot contract your pelvic muscles for 8-10 seconds, try 5-7 seconds and work your way up to 8-10 seconds. Your goal is 4-5 sets of 10 contractions each day.  Keep your stomach, buttocks, and legs relaxed during the exercises. Perform sets of both short and long contractions. Vary your positions. Perform these contractions 3-4 times per day. Perform sets while you are:    · Lying in bed in the morning.  · Standing at lunch.  · Sitting in the late afternoon.  · Lying in bed at night.   You should do 40-50 contractions per day. Do not perform more Kegel exercises per day than recommended. Overexercising can cause muscle fatigue. Continue these exercises for for at least 15-20 weeks or as directed by your caregiver.     This information is not intended to replace advice given to you by your health care provider. Make sure you discuss any questions  you have with your health care provider.     Document Released: 03/01/2012 Document Revised: 04/05/2014 Document Reviewed: 03/01/2012  Elsevier Interactive Patient Education ©2016 [employer] Inc.

## 2015-11-28 NOTE — MAU Note (Signed)
Pt had SVD last Thursday 8/24. C/o vaginal swelling after she was cleaning with peribottle. Is unsure if it is the same amount of swelling as when she was discharged. Vaginal bleeding has almost stopped. Denies pain.

## 2015-11-28 NOTE — MAU Provider Note (Signed)
  History     CSN: [id number]  Arrival date and time: 11/28/15 0039   None     Chief Complaint  Patient presents with  . Postpartum Complications  . Vaginal Pain   Vaginal Pain  Primary symptoms comment: vaginal swelling . This is a new problem. The current episode started today. The problem has been unchanged. The patient is experiencing no pain. She is not pregnant (Postpartum,). Associated symptoms include constipation (has been straining frequently. ). Pertinent negatives include no abdominal pain, chills, diarrhea, dysuria, fever, frequency, nausea, urgency or vomiting. The vaginal discharge was brown. The vaginal bleeding is spotting. Nothing aggravates the symptoms. She has tried nothing for the symptoms.    Past Medical History:  Diagnosis Date  . History of chlamydia   . Medical history non-contributory     Past Surgical History:  Procedure Laterality Date  . NO PAST SURGERIES      Family History  Problem Relation Age of Onset  . Cancer Neg Hx   . Diabetes Neg Hx   . Hypertension Neg Hx     Social History  Substance Use Topics  . Smoking status: Never Smoker  . Smokeless tobacco: Never Used  . Alcohol use Yes     Comment: Occas. prior to preg.    Allergies: No Known Allergies  Prescriptions Prior to Admission  Medication Sig Dispense Refill Last Dose  . HYDROcodone-acetaminophen (NORCO/VICODIN) 5-325 MG tablet Take 1 tablet by mouth every 4 (four) hours as needed for severe pain. 15 tablet 0   . ibuprofen (ADVIL,MOTRIN) 600 MG tablet Take 1 tablet (600 mg total) by mouth every 6 (six) hours. 30 tablet 0   . senna-docusate (SENOKOT-S) 8.6-50 MG tablet Take 2 tablets by mouth daily. 30 tablet 0     Review of Systems  Constitutional: Negative for chills and fever.  Gastrointestinal: Positive for constipation (has been straining frequently. ). Negative for abdominal pain, diarrhea, nausea and vomiting.  Genitourinary: Positive for vaginal pain. Negative for  dysuria, frequency and urgency.   Physical Exam   Blood pressure 118/82, pulse 76, temperature 98.8 F (37.1 C), temperature source Oral, resp. rate 16, last menstrual period 02/06/2015, unknown if currently breastfeeding.  Physical Exam  Nursing note and vitals reviewed. Constitutional: She appears well-developed and well-nourished. No distress.  HENT:  Head: Normocephalic.  Cardiovascular: Normal rate.   Respiratory: Effort normal.  GI: Soft. There is no tenderness. There is no rebound.  Genitourinary:  Genitourinary Comments: Cystocele noted No uterine prolapse   Neurological: She is alert.  Skin: Skin is warm and dry.  Psychiatric: She has a normal mood and affect.    MAU Course  Procedures  MDM   Assessment and Plan   1. Cystocele    DC home Comfort measures reviewed  Kegel exercises  RX: none  Return to MAU as needed  Follow-up Information    [facility] .   Specialty:  Obstetrics and Gynecology Contact information: [address] [place] [id number] [phone]           Tawnya CrookHogan, Natassja Ollis Donovan 11/28/2015, 1:05 AM

## 2015-12-06 ENCOUNTER — Encounter: Payer: Self-pay | Admitting: *Deleted

## 2015-12-11 ENCOUNTER — Inpatient Hospital Stay (HOSPITAL_COMMUNITY)
Admission: AD | Admit: 2015-12-11 | Discharge: 2015-12-11 | Disposition: A | Discharge status: Home / Self Care | Payer: Medicaid Other | Source: Ambulatory Visit | Attending: Obstetrics & Gynecology | Admitting: Obstetrics & Gynecology

## 2015-12-11 ENCOUNTER — Encounter (HOSPITAL_COMMUNITY): Payer: Self-pay | Admitting: *Deleted

## 2015-12-11 DIAGNOSIS — O9089 Other complications of the puerperium, not elsewhere classified: Secondary | ICD-10-CM | POA: Diagnosis not present

## 2015-12-11 DIAGNOSIS — N814 Uterovaginal prolapse, unspecified: Secondary | ICD-10-CM

## 2015-12-11 MED ORDER — DOCUSATE SODIUM 100 MG PO CAPS: 100.0000 mg | ORAL_CAPSULE | Freq: Two times a day (BID) | ORAL | 2 refills | Status: DC | PRN

## 2015-12-11 NOTE — MAU Provider Note (Signed)
History     CSN: [id number]  Arrival date and time: 12/11/15 1159   First Provider Initiated Contact with Patient 12/11/15 1310      Chief Complaint  Patient presents with  . Vaginal Prolapse   Kathleen SchoonerJeanne Hartstein is a 25 y.o. G2P2002 at 3 wks postpartum precipitous NSVD with 2nd degree laceration repair. She presents with concern that she has uterine prolapse. She states she feels tissue at her vaginal opening particularly when standing. She first noted this about 5 days postpartum when she strained with her first BM. No problems with urination. She came to MAU on 11/28/2015 for this same problem. She understands her diagnosis was cystocele and she has been doing some Kegel exercises without noted improvement. Otherwise she is doing well postpartum: breastfeeding and pumping, lochia minimal, not sexually active. She and her partner have questions about prognosis, treatment options, intercourse and future deliveries.    OB History  Gravida Para Term Preterm AB Living  2 2 2  0 0 2  SAB TAB Ectopic Multiple Live Births  0 0 0 0 2    # Outcome Date GA Lbr Len/2nd Weight Sex Delivery Anes PTL Lv  2 Term 11/20/15 [id number] 07:05 / 00:01 3.815 kg (8 lb 6.6 oz) F Vag-Spont Local  LIV  1 Term 2010 1073w0d  2.693 kg (5 lb 15 oz) M Vag-Spont  N LIV       Past Medical History:  Diagnosis Date  . History of chlamydia   . Medical history non-contributory     Past Surgical History:  Procedure Laterality Date  . NO PAST SURGERIES      Family History  Problem Relation Age of Onset  . Cancer Neg Hx   . Diabetes Neg Hx   . Hypertension Neg Hx     Social History  Substance Use Topics  . Smoking status: Never Smoker  . Smokeless tobacco: Never Used  . Alcohol use Yes     Comment: Occas. prior to preg.    Allergies: No Known Allergies  Prescriptions Prior to Admission  Medication Sig Dispense Refill Last Dose  . HYDROcodone-acetaminophen (NORCO/VICODIN) 5-325 MG tablet Take 1 tablet  by mouth every 4 (four) hours as needed for severe pain. (Patient not taking: Reported on 12/11/2015) 15 tablet 0 Not Taking at Unknown time  . ibuprofen (ADVIL,MOTRIN) 600 MG tablet Take 1 tablet (600 mg total) by mouth every 6 (six) hours. (Patient not taking: Reported on 12/11/2015) 30 tablet 0 Not Taking at Unknown time  . senna-docusate (SENOKOT-S) 8.6-50 MG tablet Take 2 tablets by mouth daily. (Patient not taking: Reported on 12/11/2015) 30 tablet 0 Not Taking at Unknown time    Review of Systems  Constitutional: Negative for fever.  Gastrointestinal: Negative for abdominal pain, blood in stool, constipation and diarrhea.  Genitourinary: Negative for dysuria, frequency, hematuria and urgency.       Pelvic discomfort and pressure  Musculoskeletal: Negative for myalgias.  Neurological: Negative for headaches.  Psychiatric/Behavioral: Negative for depression.       Anxious   Physical Exam   Blood pressure 115/68, pulse 98, temperature 98 F (36.7 C), temperature source Oral, resp. rate 16, height 5\' 5"  (1.651 m), weight 65.4 kg (144 lb 3.2 oz), unknown if currently breastfeeding.  Physical Exam  Nursing note and vitals reviewed. Constitutional: She is oriented to person, place, and time. She appears well-developed and well-nourished. No distress.  HENT:  Head: Normocephalic.  Eyes: Pupils are equal, round, and reactive to light.  Neck: Normal range of motion.  Cardiovascular: Normal rate.   Respiratory: Effort normal.  GI: Soft. There is no tenderness.  Genitourinary:  Genitourinary Comments: NEFG. Vagina: suture present at introitus, perineal laceration healing, intact. Decreased tone and support. Anterior wall cystocele noted with straining/extending to 1-2 cm proximal to hymen. No palpable defect in vagina. Cervix at 3-4cm proximal to hymen.  Uterus involuting well, mobile, NT  Neurological: She is alert and oriented to person, place, and time.  Skin: Skin is warm and dry.   Psychiatric: She has a normal mood and affect.    MAU Course  Procedures  No results found for this or any previous visit (from the past 24 hour(s)). MDM Reviewed with Dr. Macon Large who concurs with assessment and plan. Reassured couple and answered questions.  Assessment and Plan   1. Cystocele with uterine descensus      Medication List    STOP taking these medications   HYDROcodone-acetaminophen 5-325 MG tablet Commonly known as:  NORCO/VICODIN     TAKE these medications   docusate sodium 100 MG capsule Commonly known as:  COLACE Take 1 capsule (100 mg total) by mouth 2 (two) times daily as needed.   ibuprofen 600 MG tablet Commonly known as:  ADVIL,MOTRIN Take 1 tablet (600 mg total) by mouth every 6 (six) hours.   senna-docusate 8.6-50 MG tablet Commonly known as:  Senokot-S Take 2 tablets by mouth daily.      Follow-up Information    [facility] Follow up on 12/17/2015.   Specialty:  Obstetrics and Gynecology Why:  Keep scheduled appointment Contact information: [address] [place] [address] [phone]          Kathleen Fleming 12/11/2015, 2:04 PM

## 2015-12-11 NOTE — Discharge Instructions (Signed)
PC Muscle Toner (Pubococcygeus Muscle)  Can be done anywhere. Pretend you are urinating. Now squeeze internal muscle that would stop flow of urine. Hold contraction ____ seconds. Repeat __5__ times. Do ___5_ sessions per day.  Copyright  VHI. All rights reserved.

## 2015-12-11 NOTE — MAU Note (Signed)
Patient had a vaginal delivery on 11/20/15 and noticed a uterine prolapse about 5 days later (was seen in MAU and sent home w/ education about kegels). Patient states there has been no improvement in past two weeks.  States she is scheduled for mirena placement in October and is concerned about that. No pain or vaginal bleeding.

## 2015-12-17 ENCOUNTER — Ambulatory Visit (INDEPENDENT_AMBULATORY_CARE_PROVIDER_SITE_OTHER): Payer: Medicaid Other | Admitting: Family Medicine

## 2015-12-17 ENCOUNTER — Encounter: Payer: Self-pay | Admitting: Family Medicine

## 2015-12-17 DIAGNOSIS — N8110 Cystocele, unspecified: Secondary | ICD-10-CM | POA: Diagnosis present

## 2015-12-17 DIAGNOSIS — IMO0002 Reserved for concepts with insufficient information to code with codable children: Secondary | ICD-10-CM | POA: Insufficient documentation

## 2015-12-17 NOTE — Progress Notes (Signed)
Here for uterine prolapse. Has vaginal delivery 11/20/15. Went to MAU and sent to office.

## 2015-12-17 NOTE — Patient Instructions (Signed)

## 2015-12-17 NOTE — Progress Notes (Signed)
   Subjective:    Patient ID: Kathleen Fleming is a 25 y.o. female presenting with Uterine prolapse  on 12/17/2015  HPI: She is 4 wks pp s/p SVD of 8 lb 6 oz baby. Following BM came in to MAU with uterus falling out. Noted to have a cystocele. Doing Kegel exercises. Concerned about what this means. The size of the defect has gone down quite a lot compared to pics she has of the day it happened initially. Notes no loss of urine, dysuria, constipation.  Review of Systems  Constitutional: Negative for chills and fever.  Respiratory: Negative for shortness of breath.   Cardiovascular: Negative for chest pain.  Gastrointestinal: Negative for abdominal pain, nausea and vomiting.  Genitourinary: Negative for dysuria.  Skin: Negative for rash.      Objective:    BP 114/65   Pulse 85   Ht 5\' 5"  (1.651 m)   Wt 143 lb 14.4 oz (65.3 kg)   BMI 23.95 kg/m  Physical Exam  Constitutional: She is oriented to person, place, and time. She appears well-developed and well-nourished. No distress.  HENT:  Head: Normocephalic and atraumatic.  Eyes: No scleral icterus.  Neck: Neck supple.  Cardiovascular: Normal rate.   Pulmonary/Chest: Effort normal.  Abdominal: Soft.  Genitourinary:  Genitourinary Comments: Suture noted posterior. Good support posteriorly. Anterior cystocele noted that does not extend past the hymenal ring.  Neurological: She is alert and oriented to person, place, and time.  Skin: Skin is warm and dry.  Psychiatric: She has a normal mood and affect.        Assessment & Plan:   Problem List Items Addressed This Visit      Unprioritized   Cystocele    Advised kegel's daily 10x/day, await continued resolution of postpartum state. Reassured no impact on health and treatment if needed will occur when childbearing is complete.       Other Visit Diagnoses   None.      Total face-to-face time with patient: 15 minutes. Over 50% of encounter was spent on counseling and  coordination of care. Return for pp check.  Kathleen Fleming 12/17/2015 3:45 PM

## 2015-12-17 NOTE — Assessment & Plan Note (Signed)
Advised kegel's daily 10x/day, await continued resolution of postpartum state. Reassured no impact on health and treatment if needed will occur when childbearing is complete.

## 2016-01-05 ENCOUNTER — Inpatient Hospital Stay (HOSPITAL_COMMUNITY)
Admission: AD | Admit: 2016-01-05 | Discharge: 2016-01-05 | Disposition: A | Discharge status: Home / Self Care | Payer: Medicaid Other | Source: Ambulatory Visit | Attending: Obstetrics and Gynecology | Admitting: Obstetrics and Gynecology

## 2016-01-05 NOTE — MAU Note (Signed)
Pt presents to MAU stating that she does not want to be evaluated she just wants a referral for physical therapy. Pt states that she has been evaluated for a cystocele in MAU twice and by her OBGYN. Pt was referred to the clinic to follow up for for future plan of care.

## 2016-01-05 NOTE — MAU Provider Note (Signed)
Pt in triage stating she doesn't want to be seen just wants a referral to physical therapy related to her cystocele. Pt in no apparent distress. [facility]. Informed pt that she would need to contact her ob/gyn if all she wants is a referral for physical therapy.

## 2016-01-12 ENCOUNTER — Encounter: Payer: Self-pay | Admitting: Obstetrics and Gynecology

## 2016-01-12 ENCOUNTER — Ambulatory Visit (INDEPENDENT_AMBULATORY_CARE_PROVIDER_SITE_OTHER): Payer: Medicaid Other | Admitting: Obstetrics and Gynecology

## 2016-01-12 ENCOUNTER — Ambulatory Visit: Payer: Medicaid Other | Admitting: Clinical

## 2016-01-12 DIAGNOSIS — F411 Generalized anxiety disorder: Secondary | ICD-10-CM

## 2016-01-12 NOTE — Progress Notes (Signed)
Subjective:     Kathleen Fleming is a 25 y.o. female who presents for a postpartum visit. She is 7 weeks postpartum following a spontaneous vaginal delivery. I have fully reviewed the prenatal and intrapartum course. The delivery was at 41 gestational weeks. Outcome: spontaneous vaginal delivery. Anesthesia: none. Postpartum course has been complicated by cystocele which she is very concerned and distraught about.. Baby's course has been uncomplicated. Baby is feeding by breast. Bleeding no bleeding. Bowel function is normal. Bladder function is normal. Patient is not sexually active. Contraception method is abstinence. Postpartum depression screening: positive. Score 12. Denies PP depression per say, just says she is thinking all the time about the cystocele and doing Kegels and all the relief measures she has read about. Sounds definite about not wanting more children in the future. Seen by Dr. Shawnie PonsPratt who has [facility] (appt is 02/04/16).  The following portions of the patient's history were reviewed and updated as appropriate: allergies, current medications, past family history, past medical history, past social history, past surgical history and problem list.  Review of Systems Pertinent items are noted in HPI.   Objective:    There were no vitals taken for this visit.  General:  alert, cooperative and mild distress   Breasts:  inspection negative, no nipple discharge or bleeding, no masses or nodularity palpable  Lungs: clear to auscultation bilaterally  Heart:  regular rate and rhythm, S1, S2 normal, no murmur, click, rub or gallop  Abdomen: soft, non-tender; bowel sounds normal; no masses,  no organomegaly   Vulva:  normal  Vagina: Fair tone. Decreased support with cystocele not extending to hymenal ring  Cervix:  not evaluated  Corpus: normal size, contour, position, consistency, mobility, non-tender  Adnexa:  no mass, fullness, tenderness  Rectal Exam:  Not performed.       Thyroid not enlarged Assessment:     8 wks  postpartum exam. Pap smear not done at today's visit.   Plan:    1. Contraception: abstinence 2. Will contact us for contraception before she becomes sexually active 3. Follow up in: 6 months or as needed. 02/04/16 with Urogynecology

## 2016-01-12 NOTE — BH Specialist Note (Signed)
Session Start time: 12:05  End Time: 12: 15Total Time:  10 minutes Type of Service: Behavioral Health - Individual/Family Interpreter: No.   Interpreter Name & Language: n/a # [facility] July 2017-June 2018: [facility]st   SUBJECTIVE: Kathleen SchoonerJeanne Fleming is a 25 y.o. female  Pt. was referred by Caren Griffinseirdre Poe, CNM for:  anxiety and depression. Pt. reports the following symptoms/concerns: Pt states that her primary concern today is worry over uncertainty concerning health; does not wish to talk further today, but open to talking further at a later date. Duration of problem:  Today Severity: mild Previous treatment: none   OBJECTIVE: Mood: Anxious & Affect: Tearful Risk of harm to self or others: no known risk of harm to self or others Assessments administered: PHQ9: 10/ GAD7: 11   GOALS ADDRESSED:  -Alleviate symptoms of anxiety and depression   INTERVENTIONS: Supportive   ASSESSMENT:  Pt currently experiencing Anxious reaction.  Pt may benefit from psychoeducation and brief therapeutic intervention regarding coping with anxious and depressive symptoms.      PLAN: 1. F/U with behavioral health clinician: as needed 2. Behavioral Health meds: none 3. Behavioral recommendations:  -Consider reading educational material regarding coping with symptoms of anxiety and depression 4. Referral: Psychoeducation 5. From scale of 1-10, how likely are you to follow plan: undetermined   [facility]  Behavioral Health Clinician  Marlon PelWarmhandoff:   Warm Hand Off Completed.       Depression screen [facility] 2/9 01/12/2016 12/17/2015 10/29/2015  Decreased Interest 3 1 0  Down, Depressed, Hopeless 2 1 0  PHQ - 2 Score 5 2 0  Altered sleeping 1 0 2  Tired, decreased energy 1 0 1  Change in appetite 0 0 0  Feeling bad or failure about yourself  3 0 0  Trouble concentrating 2 0 0  Moving slowly or fidgety/restless 0 0 0  Suicidal thoughts 0 0 0  PHQ-9 Score 12 2 3    GAD 7 :  Generalized Anxiety Score 01/12/2016 12/17/2015 10/29/2015  Nervous, Anxious, on Edge 3 1 0  Control/stop worrying 3 2 0  Worry too much - different things 2 0 0  Trouble relaxing 1 0 0  Restless 0 0 0  Easily annoyed or irritable 0 0 1  Afraid - awful might happen 3 2 0  Total GAD 7 Score 12 5 1

## 2016-01-12 NOTE — Patient Instructions (Signed)

## 2018-01-01 ENCOUNTER — Encounter (HOSPITAL_COMMUNITY): Payer: Self-pay | Admitting: Emergency Medicine

## 2018-01-01 ENCOUNTER — Ambulatory Visit (HOSPITAL_COMMUNITY)
Admission: EM | Admit: 2018-01-01 | Discharge: 2018-01-01 | Disposition: A | Discharge status: Home / Self Care | Payer: BLUE CROSS/BLUE SHIELD | Attending: Internal Medicine | Admitting: Internal Medicine

## 2018-01-01 DIAGNOSIS — N898 Other specified noninflammatory disorders of vagina: Secondary | ICD-10-CM | POA: Insufficient documentation

## 2018-01-01 NOTE — Discharge Instructions (Signed)
We will wait to provide any treatment until your results have come back, to treat more specifically.  Will notify you of any positive findings and if medications are indicated.  You may also monitor your results on your MyChart.  Please withhold from intercourse until medications have been completed and symptoms have resolved. Please use condoms to prevent STD's.

## 2018-01-01 NOTE — ED Provider Notes (Signed)
[facility]    CSN: [id number] Arrival date & time: 01/01/18  1332     History   Chief Complaint Chief Complaint  Patient presents with  . Vaginal Discharge    HPI Kathleen Fleming is a 27 y.o. female.   Kathleen Fleming presents with complaints of vaginal discharge which she noticed yesterday. Dark yellow and mucus in appearance. No itching, no irritation. No pelvic pain. No spotting or bleeding. LMP 9/17. No odor. Denies any previous similar. No other abdominal or back pain. No fevers. Sexually active with 1 partner, doesn't use condoms. No specific known exposure to STD. Without contributing medical history.     ROS per HPI.      Past Medical History:  Diagnosis Date  . History of chlamydia   . Medical history non-contributory     Patient Active Problem List   Diagnosis Date Noted  . Cystocele 12/17/2015    Past Surgical History:  Procedure Laterality Date  . NO PAST SURGERIES      OB History    Gravida  2   Para  2   Term  2   Preterm  0   AB  0   Living  2     SAB  0   TAB  0   Ectopic  0   Multiple  0   Live Births  2            Home Medications    Prior to Admission medications   Not on File    Family History Family History  Problem Relation Age of Onset  . Cancer Neg Hx   . Diabetes Neg Hx   . Hypertension Neg Hx     Social History Social History   Tobacco Use  . Smoking status: Never Smoker  . Smokeless tobacco: Never Used  Substance Use Topics  . Alcohol use: Yes    Comment: Occasional   . Drug use: No     Allergies   Patient has no known allergies.   Review of Systems Review of Systems   Physical Exam Triage Vital Signs ED Triage Vitals  Enc Vitals Group     BP 01/01/18 1358 112/66     Pulse Rate 01/01/18 1358 93     Resp 01/01/18 1358 18     Temp 01/01/18 1358 98 F (36.7 C)     Temp Source 01/01/18 1358 Oral     SpO2 01/01/18 1358 100 %     Weight --      Height --      Head  Circumference --      Peak Flow --      Pain Score 01/01/18 1359 0     Pain Loc --      Pain Edu? --      Excl. in GC? --    No data found.  Updated Vital Signs BP 112/66 (BP Location: Right Arm)   Pulse 93   Temp 98 F (36.7 C) (Oral)   Resp 18   SpO2 100%   Visual Acuity Right Eye Distance:   Left Eye Distance:   Bilateral Distance:    Right Eye Near:   Left Eye Near:    Bilateral Near:     Physical Exam  Constitutional: She is oriented to person, place, and time. She appears well-developed and well-nourished. No distress.  Cardiovascular: Normal rate, regular rhythm and normal heart sounds.  Pulmonary/Chest: Effort normal and breath sounds normal.  Abdominal: Soft. There  is no tenderness. There is no rigidity, no rebound, no guarding and no CVA tenderness.  Genitourinary:  Genitourinary Comments: Denies sores, lesions, vaginal bleeding; no pelvic pain; gu exam deferred at this time, vaginal self swab collected.    Neurological: She is alert and oriented to person, place, and time.  Skin: Skin is warm and dry.     UC Treatments / Results  Labs (all labs ordered are listed, but only abnormal results are displayed) Labs Reviewed  CERVICOVAGINAL ANCILLARY ONLY    EKG None  Radiology No results found.  Procedures Procedures (including critical care time)  Medications Ordered in UC Medications - No data to display  Initial Impression / Assessment and Plan / UC Course  I have reviewed the triage vital signs and the nursing notes.  Pertinent labs & imaging results that were available during my care of the patient were reviewed by me and considered in my medical decision making (see chart for details).     Vaginal self swab collected and pending. No known exposure to STD or current suspicion. No pain, itching or odor. Opted to wait for vaginal swab results to initiate treatment. Safe sex practices encouraged. Patient verbalized understanding and agreeable to  plan.   Final Clinical Impressions(s) / UC Diagnoses   Final diagnoses:  Vaginal discharge     Discharge Instructions     We will wait to provide any treatment until your results have come back, to treat more specifically.  Will notify you of any positive findings and if medications are indicated.  You may also monitor your results on your MyChart.  Please withhold from intercourse until medications have been completed and symptoms have resolved. Please use condoms to prevent STD's.      ED Prescriptions    None     Controlled Substance Prescriptions [place] Controlled Substance Registry consulted? Not Applicable   Georgetta Haber, NP 01/01/18 1516

## 2018-01-01 NOTE — ED Triage Notes (Signed)
Pt here for vaginal discharge  

## 2018-01-02 LAB — CERVICOVAGINAL ANCILLARY ONLY
Bacterial vaginitis: POSITIVE — AB
Candida vaginitis: NEGATIVE
Chlamydia: NEGATIVE
NEISSERIA GONORRHEA: NEGATIVE
TRICH (WINDOWPATH): NEGATIVE

## 2018-01-05 ENCOUNTER — Telehealth (HOSPITAL_COMMUNITY): Payer: Self-pay

## 2018-01-05 MED ORDER — METRONIDAZOLE 250 MG PO TABS: 250.0000 mg | ORAL_TABLET | Freq: Two times a day (BID) | ORAL | 0 refills | Status: AC

## 2018-01-05 NOTE — Telephone Encounter (Signed)
Bacterial vaginosis is positive. This was not treated at the urgent care visit.  Flagyl 250 mg BID x 7 days #14 no refills sent to patients pharmacy, due to positive pregnancy status, of choice per Dr. Delton See.  Pt called and made aware of results and new prescription. Answered all questions and pt verbalized understanding.

## 2018-01-06 DIAGNOSIS — Z3200 Encounter for pregnancy test, result unknown: Secondary | ICD-10-CM | POA: Diagnosis not present

## 2018-01-06 DIAGNOSIS — Z3A01 Less than 8 weeks gestation of pregnancy: Secondary | ICD-10-CM | POA: Diagnosis not present

## 2018-09-25 DIAGNOSIS — F4323 Adjustment disorder with mixed anxiety and depressed mood: Secondary | ICD-10-CM | POA: Diagnosis not present

## 2018-10-02 DIAGNOSIS — F4323 Adjustment disorder with mixed anxiety and depressed mood: Secondary | ICD-10-CM | POA: Diagnosis not present

## 2018-10-11 DIAGNOSIS — F4323 Adjustment disorder with mixed anxiety and depressed mood: Secondary | ICD-10-CM | POA: Diagnosis not present

## 2018-10-17 DIAGNOSIS — F4323 Adjustment disorder with mixed anxiety and depressed mood: Secondary | ICD-10-CM | POA: Diagnosis not present

## 2018-10-24 DIAGNOSIS — F4323 Adjustment disorder with mixed anxiety and depressed mood: Secondary | ICD-10-CM | POA: Diagnosis not present

## 2019-11-19 ENCOUNTER — Ambulatory Visit (HOSPITAL_COMMUNITY)
Admission: EM | Admit: 2019-11-19 | Discharge: 2019-11-19 | Disposition: A | Discharge status: Home / Self Care | Payer: BC Managed Care – PPO | Attending: Family Medicine | Admitting: Family Medicine

## 2019-11-19 ENCOUNTER — Encounter (HOSPITAL_COMMUNITY): Payer: Self-pay

## 2019-11-19 ENCOUNTER — Other Ambulatory Visit: Payer: Self-pay

## 2019-11-19 DIAGNOSIS — N7689 Other specified inflammation of vagina and vulva: Secondary | ICD-10-CM | POA: Diagnosis not present

## 2019-11-19 DIAGNOSIS — N760 Acute vaginitis: Secondary | ICD-10-CM | POA: Insufficient documentation

## 2019-11-19 DIAGNOSIS — N898 Other specified noninflammatory disorders of vagina: Secondary | ICD-10-CM | POA: Diagnosis not present

## 2019-11-19 LAB — POCT URINALYSIS DIPSTICK, ED / UC
Bilirubin Urine: NEGATIVE
Glucose, UA: NEGATIVE mg/dL
Hgb urine dipstick: NEGATIVE
Ketones, ur: NEGATIVE mg/dL
Leukocytes,Ua: NEGATIVE
Nitrite: NEGATIVE
Protein, ur: NEGATIVE mg/dL
Specific Gravity, Urine: 1.025 (ref 1.005–1.030)
Urobilinogen, UA: 1 mg/dL (ref 0.0–1.0)
pH: 7 (ref 5.0–8.0)

## 2019-11-19 MED ORDER — FLUCONAZOLE 150 MG PO TABS
150.0000 mg | ORAL_TABLET | Freq: Once | ORAL | 0 refills | Status: AC
Start: 2019-11-19 — End: 2019-11-19

## 2019-11-19 MED ORDER — METRONIDAZOLE 500 MG PO TABS: 500.0000 mg | ORAL_TABLET | Freq: Two times a day (BID) | ORAL | 0 refills | Status: DC

## 2019-11-19 NOTE — ED Provider Notes (Signed)
[facility]    CSN: [id number] Arrival date & time: 11/19/19  1315      History   Chief Complaint Chief Complaint  Patient presents with  . Vaginal Irritation    HPI Kathleen Fleming is a 29 y.o. female.   HPI  Patient complains of an abnormal vaginal discharge for 7 days. Vaginal symptoms include irritation and burning. Recently begin having sexual intercourse with a new partner. Reports he is asymptomatic and she [place] unaware of any exposures to STD. Reports a history of bacterial vaginosis and yeast. Denies dysuria. Patient's last menstrual period was 10/29/2019.  Past Medical History:  Diagnosis Date  . History of chlamydia   . Medical history non-contributory     Patient Active Problem List   Diagnosis Date Noted  . Cystocele 12/17/2015    Past Surgical History:  Procedure Laterality Date  . NO PAST SURGERIES      OB History    Gravida  2   Para  2   Term  2   Preterm  0   AB  0   Living  2     SAB  0   TAB  0   Ectopic  0   Multiple  0   Live Births  2            Home Medications    Prior to Admission medications   Medication Sig Start Date End Date Taking? Authorizing Provider  fluconazole (DIFLUCAN) 150 MG tablet Take 1 tablet (150 mg total) by mouth once for 1 dose. Repeat if needed 11/19/19 11/19/19  Bing Neighbors, FNP  metroNIDAZOLE (FLAGYL) 500 MG tablet Take 1 tablet (500 mg total) by mouth 2 (two) times daily with a meal. DO NOT CONSUME ALCOHOL WHILE TAKING THIS MEDICATION. 11/19/19   Bing Neighbors, FNP    Family History Family History  Problem Relation Age of Onset  . Cancer Neg Hx   . Diabetes Neg Hx   . Hypertension Neg Hx     Social History Social History   Tobacco Use  . Smoking status: Never Smoker  . Smokeless tobacco: Never Used  Substance Use Topics  . Alcohol use: Yes    Comment: Occasional   . Drug use: No     Allergies   Patient has no known allergies.   Review of  Systems Review of Systems Pertinent negatives listed in HPI Physical Exam Triage Vital Signs ED Triage Vitals  Enc Vitals Group     BP 11/19/19 1432 116/69     Pulse Rate 11/19/19 1432 70     Resp 11/19/19 1432 18     Temp 11/19/19 1432 98.4 F (36.9 C)     Temp Source 11/19/19 1432 Oral     SpO2 11/19/19 1432 100 %     Weight --      Height --      Head Circumference --      Peak Flow --      Pain Score 11/19/19 1430 0     Pain Loc --      Pain Edu? --      Excl. in GC? --    No data found.  Updated Vital Signs BP 116/69 (BP Location: Right Arm)   Pulse 70   Temp 98.4 F (36.9 C) (Oral)   Resp 18   LMP 10/29/2019   SpO2 100%   Visual Acuity Right Eye Distance:   Left Eye Distance:   Bilateral Distance:  Right Eye Near:   Left Eye Near:    Bilateral Near:     Physical Exam General appearance: alert, well developed, well nourished, cooperativeHead: Normocephalic, without obvious abnormality, atraumatic Respiratory: Respirations even and unlabored, normal respiratory rate Heart: rate and rhythm normal. No gallop or murmurs noted on exam  Abdomen: BS +, no distention, no rebound tenderness, or no mass Extremities: No gross deformities Skin: Skin color, texture, turgor normal. No rashes seen  Psych: Appropriate mood and affect.   UC Treatments / Results  Labs (all labs ordered are listed, but only abnormal results are displayed) Labs Reviewed  POC URINE PREG, ED  POCT URINALYSIS DIPSTICK, ED / UC  CERVICOVAGINAL ANCILLARY ONLY    EKG   Radiology No results found.  Procedures Procedures (including critical care time)  Medications Ordered in UC Medications - No data to display  Initial Impression / Assessment and Plan / UC Course  I have reviewed the triage vital signs and the nursing notes.  Pertinent labs & imaging results that were available during my care of the patient were reviewed by me and considered in my medical decision making (see  chart for details).   Vaginal cytology pending. Treating for vaginitis with metronidazole and Diflucan. UA unremarkable. HCG negative. Advise will notify patient of any additional treatment warranted upon receipt of cytology results. An After Visit Summary was printed and given to the patient/family. Precautions discussed. Red flags discussed. Questions invited and answered. They voiced understanding and agreement.   Final Clinical Impressions(s) / UC Diagnoses   Final diagnoses:  Vaginitis and vulvovaginitis   Discharge Instructions   None    ED Prescriptions    Medication Sig Dispense Auth. Provider   fluconazole (DIFLUCAN) 150 MG tablet Take 1 tablet (150 mg total) by mouth once for 1 dose. Repeat if needed 2 tablet Bing Neighbors, FNP   metroNIDAZOLE (FLAGYL) 500 MG tablet Take 1 tablet (500 mg total) by mouth 2 (two) times daily with a meal. DO NOT CONSUME ALCOHOL WHILE TAKING THIS MEDICATION. 14 tablet Bing Neighbors, FNP     PDMP not reviewed this encounter.   Bing Neighbors, FNP 11/19/19 1553

## 2019-11-19 NOTE — ED Triage Notes (Signed)
Pt presents with vaginal irritation X 1 week.  

## 2019-11-20 LAB — CERVICOVAGINAL ANCILLARY ONLY
Bacterial Vaginitis (gardnerella): NEGATIVE
Candida Glabrata: NEGATIVE
Candida Vaginitis: NEGATIVE
Chlamydia: NEGATIVE
Comment: NEGATIVE
Comment: NEGATIVE
Comment: NEGATIVE
Comment: NEGATIVE
Comment: NEGATIVE
Comment: NORMAL
Neisseria Gonorrhea: NEGATIVE
Trichomonas: NEGATIVE

## 2019-12-04 ENCOUNTER — Other Ambulatory Visit: Payer: Self-pay

## 2019-12-19 DIAGNOSIS — N898 Other specified noninflammatory disorders of vagina: Secondary | ICD-10-CM | POA: Diagnosis not present

## 2019-12-19 DIAGNOSIS — Z1151 Encounter for screening for human papillomavirus (HPV): Secondary | ICD-10-CM | POA: Diagnosis not present

## 2019-12-19 DIAGNOSIS — Z01419 Encounter for gynecological examination (general) (routine) without abnormal findings: Secondary | ICD-10-CM | POA: Diagnosis not present

## 2019-12-19 DIAGNOSIS — Z6823 Body mass index (BMI) 23.0-23.9, adult: Secondary | ICD-10-CM | POA: Diagnosis not present

## 2019-12-27 DIAGNOSIS — F1010 Alcohol abuse, uncomplicated: Secondary | ICD-10-CM | POA: Diagnosis not present

## 2020-01-01 DIAGNOSIS — F1010 Alcohol abuse, uncomplicated: Secondary | ICD-10-CM | POA: Diagnosis not present

## 2020-01-03 DIAGNOSIS — F1010 Alcohol abuse, uncomplicated: Secondary | ICD-10-CM | POA: Diagnosis not present

## 2020-01-08 DIAGNOSIS — F1010 Alcohol abuse, uncomplicated: Secondary | ICD-10-CM | POA: Diagnosis not present

## 2020-01-10 DIAGNOSIS — F1010 Alcohol abuse, uncomplicated: Secondary | ICD-10-CM | POA: Diagnosis not present

## 2020-01-15 DIAGNOSIS — F1010 Alcohol abuse, uncomplicated: Secondary | ICD-10-CM | POA: Diagnosis not present

## 2020-01-22 DIAGNOSIS — F1010 Alcohol abuse, uncomplicated: Secondary | ICD-10-CM | POA: Diagnosis not present

## 2020-01-28 DIAGNOSIS — Z20822 Contact with and (suspected) exposure to covid-19: Secondary | ICD-10-CM | POA: Diagnosis not present

## 2020-01-29 DIAGNOSIS — F1010 Alcohol abuse, uncomplicated: Secondary | ICD-10-CM | POA: Diagnosis not present

## 2021-03-29 HISTORY — PX: RHINOPLASTY: SUR1284

## 2021-11-12 ENCOUNTER — Encounter (HOSPITAL_COMMUNITY): Payer: Self-pay | Admitting: Emergency Medicine

## 2021-11-12 ENCOUNTER — Ambulatory Visit (HOSPITAL_COMMUNITY)
Admission: EM | Admit: 2021-11-12 | Discharge: 2021-11-12 | Disposition: A | Discharge status: Home / Self Care | Payer: BC Managed Care – PPO | Attending: Family Medicine | Admitting: Family Medicine

## 2021-11-12 DIAGNOSIS — J329 Chronic sinusitis, unspecified: Secondary | ICD-10-CM | POA: Diagnosis not present

## 2021-11-12 DIAGNOSIS — Z9889 Other specified postprocedural states: Secondary | ICD-10-CM

## 2021-11-12 MED ORDER — AMOXICILLIN-POT CLAVULANATE 875-125 MG PO TABS: 1.0000 | ORAL_TABLET | Freq: Two times a day (BID) | ORAL | 0 refills | Status: AC

## 2021-11-12 MED ORDER — METHYLPREDNISOLONE SODIUM SUCC 125 MG IJ SOLR
INTRAMUSCULAR | Status: AC
  Filled 2021-11-12: qty 2

## 2021-11-12 MED ORDER — METHYLPREDNISOLONE ACETATE 80 MG/ML IJ SUSP
INTRAMUSCULAR | Status: AC
  Filled 2021-11-12: qty 2

## 2021-11-12 MED ORDER — METHYLPREDNISOLONE SODIUM SUCC 125 MG IJ SOLR
80.0000 mg | Freq: Once | INTRAMUSCULAR | Status: AC
  Administered 2021-11-12: 80 mg via INTRAMUSCULAR

## 2021-11-12 NOTE — ED Triage Notes (Signed)
Patient c/o nasal drainage and nasal swelling that started 1 week ago.   Patient had rhinoplasty done 3 week ago on approximately 10/19/2021 in [place]. Patient spoke with surgeon and was suggested to received "stronger antibiotics".   Patient denies any pain. Patient denies any foul odor to drainage.   Patient endorses "yellow sticky" drainage.   Patient is currently taking Ciprofloxacin 500 mg and using an antibiotic ointment.

## 2021-11-12 NOTE — Discharge Instructions (Signed)
You were seen today for infection in your sinuses after rhinoplasty.  You were given a shot of steroid here.  I have sent out an oral antibiotic to take twice/day x 10 days.  Please take this with food to avoid upset stomach.  You may stop the ciprofloxacin at this time.

## 2021-11-12 NOTE — ED Provider Notes (Signed)
[facility]    CSN: [id number] Arrival date & time: 11/12/21  1227      History   Chief Complaint Chief Complaint  Patient presents with   Post-op Problem    HPI Kathleen Fleming is a 31 y.o. female.   She got a rhinoplasty about two weeks ago, while in  [place].  It has been swollen, which is usual for her.  She did push on it and fluid did drain from her nostril.  However, she continues to have intermittent swelling and drainage.  She also feels she has a sore in the left nostril that is not resolving with topicals.  She is currently on cipro. Her surgeon recommended she get a stronger abx, and also a steroid shot for treatment.   Past Medical History:  Diagnosis Date   History of chlamydia    Medical history non-contributory     Patient Active Problem List   Diagnosis Date Noted   Cystocele 12/17/2015    Past Surgical History:  Procedure Laterality Date   RHINOPLASTY  2023    OB History     Gravida  2   Para  2   Term  2   Preterm  0   AB  0   Living  2      SAB  0   IAB  0   Ectopic  0   Multiple  0   Live Births  2            Home Medications    Prior to Admission medications   Medication Sig Start Date End Date Taking? Authorizing Provider  ciprofloxacin (CIPRO) 500 MG tablet Take 500 mg by mouth 2 (two) times daily.   Yes [provider]  metroNIDAZOLE (FLAGYL) 500 MG tablet Take 1 tablet (500 mg total) by mouth 2 (two) times daily with a meal. DO NOT CONSUME ALCOHOL WHILE TAKING THIS MEDICATION. 11/19/19   Bing Neighbors, FNP    Family History Family History  Problem Relation Age of Onset   Cancer Neg Hx    Diabetes Neg Hx    Hypertension Neg Hx     Social History Social History   Tobacco Use   Smoking status: Never   Smokeless tobacco: Never  Vaping Use   Vaping Use: Never used  Substance Use Topics   Alcohol use: Yes    Comment: Occasional    Drug use: No     Allergies   Patient  has no known allergies.   Review of Systems Review of Systems  Constitutional: Negative.   HENT:  Positive for rhinorrhea. Negative for ear pain, facial swelling, nosebleeds, sinus pressure and sore throat.   Respiratory: Negative.    Cardiovascular: Negative.   Gastrointestinal: Negative.   Genitourinary: Negative.   Musculoskeletal: Negative.   Psychiatric/Behavioral: Negative.       Physical Exam Triage Vital Signs ED Triage Vitals  Enc Vitals Group     BP 11/12/21 1237 110/78     Pulse Rate 11/12/21 1237 98     Resp 11/12/21 1237 16     Temp 11/12/21 1237 98.1 F (36.7 C)     Temp Source 11/12/21 1237 Oral     SpO2 11/12/21 1237 99 %     Weight --      Height --      Head Circumference --      Peak Flow --      Pain Score 11/12/21 1243 0  Pain Loc --      Pain Edu? --      Excl. in GC? --    No data found.  Updated Vital Signs BP 110/78 (BP Location: Right Arm)   Pulse 98   Temp 98.1 F (36.7 C) (Oral)   Resp 16   LMP 11/08/2021 (Exact Date)   SpO2 99%   Breastfeeding No   Visual Acuity Right Eye Distance:   Left Eye Distance:   Bilateral Distance:    Right Eye Near:   Left Eye Near:    Bilateral Near:     Physical Exam HENT:     Head: Normocephalic.     Comments: Bandages in place across the nasal bridge;  Slight swelling to the nose noted;  nares have slight drainage, post op healing is noted Cardiovascular:     Rate and Rhythm: Normal rate and regular rhythm.  Pulmonary:     Effort: Pulmonary effort is normal.     Breath sounds: Normal breath sounds.  Musculoskeletal:     Cervical back: Normal range of motion.  Skin:    General: Skin is warm and dry.  Neurological:     General: No focal deficit present.     Mental Status: She is alert.  Psychiatric:        Mood and Affect: Mood normal.      UC Treatments / Results  Labs (all labs ordered are listed, but only abnormal results are displayed) Labs Reviewed - No data to  display  EKG   Radiology No results found.  Procedures Procedures (including critical care time)  Medications Ordered in UC Medications  methylPREDNISolone sodium succinate (SOLU-MEDROL) 125 mg/2 mL injection 80 mg (has no administration in time range)    Initial Impression / Assessment and Plan / UC Course  I have reviewed the triage vital signs and the nursing notes.  Pertinent labs & imaging results that were available during my care of the patient were reviewed by me and considered in my medical decision making (see chart for details).  Patient seen today for continued nasal swelling and drainage after rhinoplasty.  She was recommended to be seen for stronger antibiotics and a steroid injection to help with swelling.  These were given to her today per the recommendation of her surgeon;  Recommend she follow up if not improving, or discuss further with her surgeon.   Final Clinical Impressions(s) / UC Diagnoses   Final diagnoses:  Sinusitis, unspecified chronicity, unspecified location  S/P rhinoplasty     Discharge Instructions      You were seen today for infection in your sinuses after rhinoplasty.  You were given a shot of steroid here.  I have sent out an oral antibiotic to take twice/day x 10 days.  Please take this with food to avoid upset stomach.  You may stop the ciprofloxacin at this time.     ED Prescriptions     Medication Sig Dispense Auth. Provider   amoxicillin-clavulanate (AUGMENTIN) 875-125 MG tablet Take 1 tablet by mouth every 12 (twelve) hours for 10 days. 20 tablet Jannifer Franklin, MD      PDMP not reviewed this encounter.   Jannifer Franklin, MD 11/12/21 1319

## 2022-10-12 ENCOUNTER — Ambulatory Visit (HOSPITAL_COMMUNITY): Payer: BC Managed Care – PPO

## 2023-04-28 ENCOUNTER — Ambulatory Visit (HOSPITAL_COMMUNITY)
Admission: EM | Admit: 2023-04-28 | Discharge: 2023-04-28 | Disposition: A | Discharge status: Home / Self Care | Payer: BC Managed Care – PPO | Attending: Family Medicine | Admitting: Family Medicine

## 2023-04-28 ENCOUNTER — Encounter (HOSPITAL_COMMUNITY): Payer: Self-pay

## 2023-04-28 DIAGNOSIS — J069 Acute upper respiratory infection, unspecified: Secondary | ICD-10-CM

## 2023-04-28 DIAGNOSIS — J101 Influenza due to other identified influenza virus with other respiratory manifestations: Secondary | ICD-10-CM | POA: Diagnosis not present

## 2023-04-28 LAB — POCT URINE PREGNANCY: Preg Test, Ur: NEGATIVE

## 2023-04-28 LAB — POC COVID19/FLU A&B COMBO
Covid Antigen, POC: NEGATIVE
Influenza A Antigen, POC: POSITIVE — AB
Influenza B Antigen, POC: NEGATIVE

## 2023-04-28 MED ORDER — ONDANSETRON 4 MG PO TBDP: 4.0000 mg | ORAL_TABLET | Freq: Four times a day (QID) | ORAL | 0 refills | Status: AC | PRN

## 2023-04-28 MED ORDER — BENZONATATE 100 MG PO CAPS: 100.0000 mg | ORAL_CAPSULE | Freq: Three times a day (TID) | ORAL | 0 refills | Status: AC | PRN

## 2023-04-28 MED ORDER — CETIRIZINE HCL 10 MG PO TABS: 10.0000 mg | ORAL_TABLET | Freq: Every day | ORAL | 2 refills | Status: AC

## 2023-04-28 MED ORDER — ONDANSETRON 4 MG PO TBDP
ORAL_TABLET | ORAL | Status: AC
  Filled 2023-04-28: qty 1

## 2023-04-28 MED ORDER — IBUPROFEN 600 MG PO TABS: 600.0000 mg | ORAL_TABLET | Freq: Four times a day (QID) | ORAL | 0 refills | Status: AC | PRN

## 2023-04-28 MED ORDER — CYCLOBENZAPRINE HCL 10 MG PO TABS: 10.0000 mg | ORAL_TABLET | Freq: Two times a day (BID) | ORAL | 0 refills | Status: AC | PRN

## 2023-04-28 MED ORDER — ONDANSETRON 4 MG PO TBDP: 4.0000 mg | ORAL_TABLET | Freq: Once | ORAL | Status: DC

## 2023-04-28 NOTE — ED Triage Notes (Signed)
Vomiting, chills, sweats, cough, body aches x 4 days. Started with emesis and diarrhea Monday afternoon, that night then had chills, sweats, body aches and cough. No known sick exposure.   Patient tried Theraflu, robitussin, and elder berry with  no relief.

## 2023-04-28 NOTE — ED Provider Notes (Signed)
[facility]    CSN: [id number] Arrival date & time: 04/28/23  0808     History   Chief Complaint Chief Complaint  Patient presents with   Cough   Emesis    HPI Kathleen Fleming is a 33 y.o. female.  3 day history of chills, sweats, body aches, runny nose, and dry cough. On first day of symptoms had emesis and diarrhea, those resolved. Able to tolerate fluids. Has not taken a temp at home Tried theraflu, robitussin, elderberry syrup Possible sick contact Nor recent travel   LMP unknown, implant   Past Medical History:  Diagnosis Date   History of chlamydia    Medical history non-contributory     Patient Active Problem List   Diagnosis Date Noted   Cystocele 12/17/2015    Past Surgical History:  Procedure Laterality Date   RHINOPLASTY  2023    OB History     Gravida  2   Para  2   Term  2   Preterm  0   AB  0   Living  2      SAB  0   IAB  0   Ectopic  0   Multiple  0   Live Births  2            Home Medications    Prior to Admission medications   Medication Sig Start Date End Date Taking? Authorizing Provider  benzonatate (TESSALON) 100 MG capsule Take 1 capsule (100 mg total) by mouth 3 (three) times daily as needed for cough. 04/28/23  Yes Kaesha Kirsch, Lurena Joiner, PA-C  cetirizine (ZYRTEC ALLERGY) 10 MG tablet Take 1 tablet (10 mg total) by mouth daily. 04/28/23  Yes Palestine Mosco, Lurena Joiner, PA-C  cyclobenzaprine (FLEXERIL) 10 MG tablet Take 1 tablet (10 mg total) by mouth 2 (two) times daily as needed for muscle spasms. 04/28/23  Yes Miaya Lafontant, Lurena Joiner, PA-C  ibuprofen (ADVIL) 600 MG tablet Take 1 tablet (600 mg total) by mouth every 6 (six) hours as needed. 04/28/23  Yes Anmol Paschen, Lurena Joiner, PA-C  ondansetron (ZOFRAN-ODT) 4 MG disintegrating tablet Take 1 tablet (4 mg total) by mouth every 6 (six) hours as needed for nausea or vomiting. 04/28/23  Yes Manali Mcelmurry, PA-C  Etonogestrel (NEXPLANON [place]) Inject into the skin.    [provider]    Family History Family History  Problem Relation Age of Onset   Cancer Neg Hx    Diabetes Neg Hx    Hypertension Neg Hx     Social History Social History   Tobacco Use   Smoking status: Never   Smokeless tobacco: Never  Vaping Use   Vaping status: Never Used  Substance Use Topics   Alcohol use: Not Currently    Comment: Occasional    Drug use: Never     Allergies   Patient has no known allergies.   Review of Systems Review of Systems Per HPI  Physical Exam Triage Vital Signs ED Triage Vitals [04/28/23 0854]  Encounter Vitals Group     BP      Systolic BP Percentile      Diastolic BP Percentile      Pulse      Resp      Temp      Temp src      SpO2      Weight      Height      Head Circumference      Peak Flow  Pain Score 3     Pain Loc      Pain Education      Exclude from Growth Chart    No data found.  Updated Vital Signs BP 112/78 (BP Location: Right Arm)   Pulse 96   Temp 98 F (36.7 C) (Oral)   Resp 16   Ht 5\' 5"  (1.651 m)   Wt 130 lb (59 kg)   LMP  (LMP Unknown)   SpO2 97%   BMI 21.63 kg/m    Physical Exam Vitals and nursing note reviewed.  Constitutional:      Appearance: She is not ill-appearing.  HENT:     Right Ear: Tympanic membrane and ear canal normal.     Left Ear: Tympanic membrane and ear canal normal.     Nose: No rhinorrhea.     Mouth/Throat:     Mouth: Mucous membranes are moist.     Pharynx: Oropharynx is clear. No posterior oropharyngeal erythema.  Eyes:     Conjunctiva/sclera: Conjunctivae normal.  Cardiovascular:     Rate and Rhythm: Normal rate and regular rhythm.     Heart sounds: Normal heart sounds.  Pulmonary:     Effort: Pulmonary effort is normal.     Breath sounds: Normal breath sounds. No wheezing or rales.  Abdominal:     General: There is no distension.     Palpations: Abdomen is soft.     Tenderness: There is no abdominal tenderness.  Musculoskeletal:     Cervical back: Normal  range of motion.  Lymphadenopathy:     Cervical: No cervical adenopathy.  Skin:    General: Skin is warm and dry.  Neurological:     Mental Status: She is alert and oriented to person, place, and time.     UC Treatments / Results  Labs (all labs ordered are listed, but only abnormal results are displayed) Labs Reviewed  POC COVID19/FLU A&B COMBO - Abnormal; Notable for the following components:      Result Value   Influenza A Antigen, POC Positive (*)    All other components within normal limits  POCT URINE PREGNANCY    EKG  Radiology No results found.  Procedures Procedures   Medications Ordered in UC Medications - No data to display  Initial Impression / Assessment and Plan / UC Course  I have reviewed the triage vital signs and the nursing notes.  Pertinent labs & imaging results that were available during my care of the patient were reviewed by me and considered in my medical decision making (see chart for details).  Afebrile in clinic  UPT negative Discussed out of window for flu antivirals and do not recommend testing, but patient would like testing today Rapid flu A is positive.  Discussed viral etiology, symptomatic care. Sent tessalon, zyrtec, flexeril, ibuprofen, and can try zofran if needed/if nausea returns. Otherwise likely prognosis of virus is discussed, and reasons to return to clinic. A work note is provided. Patient agrees to plan, no questions at this time  Final Clinical Impressions(s) / UC Diagnoses   Final diagnoses:  Viral URI with cough  Influenza A     Discharge Instructions      You are positive for the flu. This is a viral illness. The best treatment is symptomatic care!  The zofran can be used every 6 hours as needed to settle the stomach  The tessalon cough pills can be taken 3x daily. If this medication makes you drowsy, take only one  pill before bed.  You can take the muscle relaxer Flexeril twice daily. If the medication  makes you drowsy, take only at bed time.  I recommend ibuprofen every 6 hours as needed, can also alternate with tylenol  Daily zyrtec can help with runny nose     ED Prescriptions     Medication Sig Dispense Auth. Provider   ondansetron (ZOFRAN-ODT) 4 MG disintegrating tablet Take 1 tablet (4 mg total) by mouth every 6 (six) hours as needed for nausea or vomiting. 20 tablet Macguire Holsinger, PA-C   benzonatate (TESSALON) 100 MG capsule Take 1 capsule (100 mg total) by mouth 3 (three) times daily as needed for cough. 30 capsule Burlin Mcnair, PA-C   cyclobenzaprine (FLEXERIL) 10 MG tablet Take 1 tablet (10 mg total) by mouth 2 (two) times daily as needed for muscle spasms. 20 tablet Jaikob Borgwardt, PA-C   ibuprofen (ADVIL) 600 MG tablet Take 1 tablet (600 mg total) by mouth every 6 (six) hours as needed. 30 tablet Negar Sieler, PA-C   cetirizine (ZYRTEC ALLERGY) 10 MG tablet Take 1 tablet (10 mg total) by mouth daily. 30 tablet Calvina Liptak, Lurena Joiner, PA-C      PDMP not reviewed this encounter.   Gracyn Allor, Lurena Joiner, [place] 04/28/23 1009

## 2023-04-28 NOTE — Discharge Instructions (Addendum)
You are positive for the flu. This is a viral illness. The best treatment is symptomatic care!  The zofran can be used every 6 hours as needed to settle the stomach  The tessalon cough pills can be taken 3x daily. If this medication makes you drowsy, take only one pill before bed.  You can take the muscle relaxer Flexeril twice daily. If the medication makes you drowsy, take only at bed time.  I recommend ibuprofen every 6 hours as needed, can also alternate with tylenol  Daily zyrtec can help with runny nose

## 2023-09-30 ENCOUNTER — Ambulatory Visit (HOSPITAL_COMMUNITY)
Admission: EM | Admit: 2023-09-30 | Discharge: 2023-09-30 | Disposition: A | Discharge status: Home / Self Care | Attending: Emergency Medicine | Admitting: Emergency Medicine

## 2023-09-30 ENCOUNTER — Encounter (HOSPITAL_COMMUNITY): Payer: Self-pay

## 2023-09-30 DIAGNOSIS — L700 Acne vulgaris: Secondary | ICD-10-CM

## 2023-09-30 MED ORDER — CLINDAMYCIN PHOSPHATE 1 % EX LOTN: TOPICAL_LOTION | Freq: Two times a day (BID) | CUTANEOUS | 0 refills | Status: AC

## 2023-09-30 NOTE — ED Provider Notes (Signed)
 [facility]    CSN: [id number] Arrival date & time: 09/30/23  1119      History   Chief Complaint Chief Complaint  Patient presents with   Rash    HPI Kathleen Fleming is a 33 y.o. female. Hx acne, has seen dermatology before, given rx for some kind of lotion that relieved it. CAn't get into derm quickly, is asking for rx for lotion. After looking at names and images of products, pt is certain she used clindamycin  lotion before and wants it again. Is using benzoyl peroxide but it is not enough to treat it  Shows me pictures of her face and neck. Some lesions are less obvious at different times of the day she says   Rash   Past Medical History:  Diagnosis Date   History of chlamydia    Medical history non-contributory     Patient Active Problem List   Diagnosis Date Noted   Cystocele 12/17/2015    Past Surgical History:  Procedure Laterality Date   RHINOPLASTY  2023    OB History     Gravida  2   Para  2   Term  2   Preterm  0   AB  0   Living  2      SAB  0   IAB  0   Ectopic  0   Multiple  0   Live Births  2            Home Medications    Prior to Admission medications   Medication Sig Start Date End Date Taking? Authorizing Provider  clindamycin  (CLEOCIN  T) 1 % lotion Apply topically 2 (two) times daily. 09/30/23  Yes Richad Jon HERO, NP  Etonogestrel (NEXPLANON ) Inject into the skin.   Yes [provider]  benzonatate  (TESSALON ) 100 MG capsule Take 1 capsule (100 mg total) by mouth 3 (three) times daily as needed for cough. 04/28/23   Rising, Asberry, PA-C  cetirizine  (ZYRTEC  ALLERGY) 10 MG tablet Take 1 tablet (10 mg total) by mouth daily. 04/28/23   Rising, Asberry, PA-C  cyclobenzaprine  (FLEXERIL ) 10 MG tablet Take 1 tablet (10 mg total) by mouth 2 (two) times daily as needed for muscle spasms. 04/28/23   Rising, Asberry, PA-C  ibuprofen  (ADVIL ) 600 MG tablet Take 1 tablet (600 mg total) by mouth every 6 (six)  hours as needed. 04/28/23   Rising, Asberry, PA-C  ondansetron  (ZOFRAN -ODT) 4 MG disintegrating tablet Take 1 tablet (4 mg total) by mouth every 6 (six) hours as needed for nausea or vomiting. 04/28/23   Rising, Asberry, PA-C    Family History Family History  Problem Relation Age of Onset   Cancer Neg Hx    Diabetes Neg Hx    Hypertension Neg Hx     Social History Social History   Tobacco Use   Smoking status: Never   Smokeless tobacco: Never  Vaping Use   Vaping status: Never Used  Substance Use Topics   Alcohol use: Not Currently    Comment: Occasional    Drug use: Never     Allergies   Patient has no known allergies.   Review of Systems Review of Systems  Skin:  Positive for rash.     Physical Exam Triage Vital Signs ED Triage Vitals  Encounter Vitals Group     BP 09/30/23 1141 107/75     Girls Systolic BP Percentile --      Girls Diastolic BP Percentile --  Boys Systolic BP Percentile --      Boys Diastolic BP Percentile --      Pulse Rate 09/30/23 1141 80     Resp 09/30/23 1141 17     Temp 09/30/23 1141 98.1 F (36.7 C)     Temp Source 09/30/23 1141 Oral     SpO2 09/30/23 1141 98 %     Weight --      Height --      Head Circumference --      Peak Flow --      Pain Score 09/30/23 1140 0     Pain Loc --      Pain Education --      Exclude from Growth Chart --    No data found.  Updated Vital Signs BP 107/75 (BP Location: Right Arm)   Pulse 80   Temp 98.1 F (36.7 C) (Oral)   Resp 17   SpO2 98%   Visual Acuity Right Eye Distance:   Left Eye Distance:   Bilateral Distance:    Right Eye Near:   Left Eye Near:    Bilateral Near:     Physical Exam Constitutional:      Appearance: Normal appearance.  Pulmonary:     Effort: Pulmonary effort is normal.  Skin:    Findings: Acne present.     Comments: She does have some macules and small inflamed papules on face and neck  Neurological:     Mental Status: She is alert.      UC  Treatments / Results  Labs (all labs ordered are listed, but only abnormal results are displayed) Labs Reviewed - No data to display  EKG   Radiology No results found.  Procedures Procedures (including critical care time)  Medications Ordered in UC Medications - No data to display  Initial Impression / Assessment and Plan / UC Course  I have reviewed the triage vital signs and the nursing notes.  Pertinent labs & imaging results that were available during my care of the patient were reviewed by me and considered in my medical decision making (see chart for details).     Sx c/w acne. No signficant skin infection. Will rx clindamycin . She will continue to use benzoyl peroxide along with it.  Final Clinical Impressions(s) / UC Diagnoses   Final diagnoses:  Acne vulgaris     Discharge Instructions      Continue to use with benzoyl peroxide for best effectiveness.   Reach out to the dermatologist office to get follow up appointment for the future.     ED Prescriptions     Medication Sig Dispense Auth. Provider   clindamycin  (CLEOCIN  T) 1 % lotion Apply topically 2 (two) times daily. 60 mL Richad Jon HERO, NP      PDMP not reviewed this encounter.   Richad Jon HERO, NP 09/30/23 1213

## 2023-09-30 NOTE — Discharge Instructions (Signed)
 Continue to use with benzoyl peroxide for best effectiveness.   Reach out to the dermatologist office to get follow up appointment for the future.

## 2023-09-30 NOTE — ED Triage Notes (Signed)
 Rash on face and back. Thinks it may be fungal acne.
